# Patient Record
Sex: Male | Born: 1995 | Race: White | Hispanic: No | Marital: Single | Smoking: Current every day smoker
Health system: Southern US, Community
[De-identification: ages and names within clinical notes are randomized; demographics above are authoritative.]

## PROBLEM LIST (undated history)

## (undated) DIAGNOSIS — F32A Depression, unspecified: Secondary | ICD-10-CM

## (undated) DIAGNOSIS — F329 Major depressive disorder, single episode, unspecified: Secondary | ICD-10-CM

## (undated) DIAGNOSIS — F419 Anxiety disorder, unspecified: Secondary | ICD-10-CM

---
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
## 2013-01-09 ENCOUNTER — Ambulatory Visit (HOSPITAL_COMMUNITY): Payer: Self-pay | Admitting: Physician Assistant

## 2014-04-21 ENCOUNTER — Encounter (HOSPITAL_COMMUNITY): Payer: Self-pay | Admitting: Emergency Medicine

## 2014-04-21 ENCOUNTER — Emergency Department (HOSPITAL_COMMUNITY): Payer: Managed Care, Other (non HMO)

## 2014-04-21 ENCOUNTER — Emergency Department (HOSPITAL_COMMUNITY)
Admission: EM | Admit: 2014-04-21 | Discharge: 2014-04-22 | Disposition: A | Discharge status: Home / Self Care | Payer: Managed Care, Other (non HMO) | Attending: Emergency Medicine | Admitting: Emergency Medicine

## 2014-04-21 DIAGNOSIS — IMO0002 Reserved for concepts with insufficient information to code with codable children: Secondary | ICD-10-CM

## 2014-04-21 DIAGNOSIS — R109 Unspecified abdominal pain: Secondary | ICD-10-CM | POA: Diagnosis not present

## 2014-04-21 DIAGNOSIS — N5089 Other specified disorders of the male genital organs: Secondary | ICD-10-CM | POA: Insufficient documentation

## 2014-04-21 DIAGNOSIS — N509 Disorder of male genital organs, unspecified: Secondary | ICD-10-CM | POA: Insufficient documentation

## 2014-04-21 DIAGNOSIS — R599 Enlarged lymph nodes, unspecified: Secondary | ICD-10-CM | POA: Diagnosis not present

## 2014-04-21 LAB — URINE MICROSCOPIC-ADD ON

## 2014-04-21 LAB — URINALYSIS, ROUTINE W REFLEX MICROSCOPIC
Bilirubin Urine: NEGATIVE
GLUCOSE, UA: NEGATIVE mg/dL
Hgb urine dipstick: NEGATIVE
KETONES UR: 15 mg/dL — AB
LEUKOCYTES UA: NEGATIVE
Nitrite: NEGATIVE
PH: 6.5 (ref 5.0–8.0)
PROTEIN: 30 mg/dL — AB
SPECIFIC GRAVITY, URINE: 1.025 (ref 1.005–1.030)
UROBILINOGEN UA: 1 mg/dL (ref 0.0–1.0)

## 2014-04-21 NOTE — ED Notes (Signed)
Pt unable to provide urine specimen at this time, will reattempt.

## 2014-04-21 NOTE — ED Notes (Signed)
Initial Contact - pt A+Ox4, ambulatory in room for comfort, family at bedside.  Pt reports bilat testicular pain today, reports history of similar but it has resolved and this pain is worse than previously.  Pt denies dysuria or other complaints. Skin PWD.  Speaking full/clear sentences, rr even/un-lab.  NAD.

## 2014-04-21 NOTE — ED Provider Notes (Signed)
CSN: [id number]     Arrival date & time 04/21/14  2059 History   First MD Initiated Contact with Patient 04/21/14 2202     Chief Complaint  Patient presents with  . Testicle Pain     (Consider location/radiation/quality/duration/timing/severity/associated sxs/prior Treatment) HPI Comments: Patient presents with c/o scrotal pain that has been occuring frequently for the past one year. Pain more severe and everyday recently. Pain worse with movement of area and palpation. Pain radiates into abdomen and upper legs. No dysuria or penile discharge. No fever. No CP, SOB. No diarrhea. He has noted swelling in 'the tubes that connect my testicles'. The onset of this condition was acute. The course is recurrent. Aggravating factors: none. Alleviating factors: none.    The history is provided by the patient.    History reviewed. No pertinent past medical history. History reviewed. No pertinent past surgical history. Family History  Problem Relation Age of Onset  . Bipolar disorder Father   . Bipolar disorder Paternal Grandmother    History  Substance Use Topics  . Smoking status: Never Smoker   . Smokeless tobacco: Never Used  . Alcohol Use: No    Review of Systems  Constitutional: Negative for fever.  HENT: Negative for rhinorrhea and sore throat.   Eyes: Negative for redness.  Respiratory: Negative for cough.   Cardiovascular: Negative for chest pain.  Gastrointestinal: Positive for abdominal pain. Negative for nausea, vomiting and diarrhea.  Genitourinary: Positive for scrotal swelling and testicular pain. Negative for dysuria, urgency, frequency, hematuria, flank pain, decreased urine volume, discharge, penile swelling, difficulty urinating, genital sores and penile pain.  Musculoskeletal: Negative for back pain and myalgias.  Skin: Negative for rash.  Neurological: Negative for headaches.   Allergies  Review of patient's allergies indicates no known allergies.  Home Medications    Prior to Admission medications   Not on File   BP 123/71  Pulse 94  Temp(Src) 99.3 F (37.4 C) (Oral)  Resp 20  SpO2 100%  Physical Exam  Nursing note and vitals reviewed. Constitutional: He appears well-developed and well-nourished.  HENT:  Head: Normocephalic and atraumatic.  Eyes: Conjunctivae are normal. Right eye exhibits no discharge. Left eye exhibits no discharge.  Neck: Normal range of motion. Neck supple.  Cardiovascular: Normal rate, regular rhythm and normal heart sounds.   Pulmonary/Chest: Effort normal and breath sounds normal.  Abdominal: Soft. There is no tenderness. Hernia confirmed negative in the right inguinal area and confirmed negative in the left inguinal area.  Genitourinary: Penis normal. Right testis shows tenderness. Right testis shows no swelling. Left testis shows tenderness. Left testis shows no swelling. Circumcised. No penile erythema or penile tenderness. No discharge found.  Lymphadenopathy:       Right: Inguinal (small, few, non-tender) adenopathy present.       Left: Inguinal (small, few, non-tender) adenopathy present.  Neurological: He is alert.  Skin: Skin is warm and dry.  Psychiatric: He has a normal mood and affect.    ED Course  Procedures (including critical care time) Labs Review Labs Reviewed  URINALYSIS, ROUTINE W REFLEX MICROSCOPIC - Abnormal; Notable for the following:    Ketones, ur 15 (*)    Protein, ur 30 (*)    All other components within normal limits  URINE MICROSCOPIC-ADD ON    Imaging Review US Scrotum  04/22/2014   CLINICAL DATA:  Bilateral testicular pain/swelling  EXAM: SCROTAL ULTRASOUND  DOPPLER ULTRASOUND OF THE TESTICLES  TECHNIQUE: Complete ultrasound examination of the testicles, epididymis,  and other scrotal structures was performed. Color and spectral Doppler ultrasound were also utilized to evaluate blood flow to the testicles.  COMPARISON:  None.  FINDINGS: Right testicle  Measurements: 4.5 x 2.2 x 2.7  cm. No mass or microlithiasis visualized.  Left testicle  Measurements: 4.5 x 2.0 x 2.6 cm. No mass or microlithiasis visualized.  Right epididymis:  Normal in size and appearance.  Left epididymis:  Normal in size and appearance.  Hydrocele:  None visualized.  Varicocele:  None visualized.  Pulsed Doppler interrogation of both testes demonstrates low resistance arterial and venous waveforms bilaterally.  IMPRESSION: Negative scrotal ultrasound.  No evidence of testicular torsion.   Electronically Signed   By: Charline Bills M.D.   On: 04/22/2014 00:10   [place] Art/ven Flow Abd Pelv Doppler  04/22/2014   CLINICAL DATA:  Bilateral testicular pain/swelling  EXAM: SCROTAL ULTRASOUND  DOPPLER ULTRASOUND OF THE TESTICLES  TECHNIQUE: Complete ultrasound examination of the testicles, epididymis, and other scrotal structures was performed. Color and spectral Doppler ultrasound were also utilized to evaluate blood flow to the testicles.  COMPARISON:  None.  FINDINGS: Right testicle  Measurements: 4.5 x 2.2 x 2.7 cm. No mass or microlithiasis visualized.  Left testicle  Measurements: 4.5 x 2.0 x 2.6 cm. No mass or microlithiasis visualized.  Right epididymis:  Normal in size and appearance.  Left epididymis:  Normal in size and appearance.  Hydrocele:  None visualized.  Varicocele:  None visualized.  Pulsed Doppler interrogation of both testes demonstrates low resistance arterial and venous waveforms bilaterally.  IMPRESSION: Negative scrotal ultrasound.  No evidence of testicular torsion.   Electronically Signed   By: Charline Bills M.D.   On: 04/22/2014 00:10     EKG Interpretation None      10:34 PM Patient seen and examined. Work-up initiated. Patient does not want pain medications.   Vital signs reviewed and are as follows: BP 123/71  Pulse 94  Temp(Src) 99.3 F (37.4 C) (Oral)  Resp 20  SpO2 100%  12:34 AM Pt informed of results. Urology f/u given. Also given pain medications for home. Patient  urged to return with worsening symptoms or other concerns. Patient verbalized understanding and agrees with plan.    MDM   Final diagnoses:  Testicular/scrotal pain   Patient with groin pain x 1 year. Exam is unremarkable except for tenderness. No torsion or other explanation of pain on UA or [place]. No signs of hernia, let alone incarcerated or strangulated hernia.     Renne Crigler, PA-C 04/22/14 [phone]

## 2014-04-21 NOTE — ED Notes (Signed)
Pt arrived to the ED with a complaint of bilateral testicle pain.  Pt states he has recurrent testicle pain for a year but the pain has gradually gotten worse until today when it was unbearable. Pt describes pain as a "swelling of the tube in my testicles that causes pain that shoots into my stomach."

## 2014-04-22 MED ORDER — TRAMADOL HCL 50 MG PO TABS: 50.0000 mg | ORAL_TABLET | Freq: Four times a day (QID) | ORAL | Status: DC | PRN

## 2014-04-22 MED ORDER — IBUPROFEN 600 MG PO TABS: 600.0000 mg | ORAL_TABLET | Freq: Four times a day (QID) | ORAL | Status: DC | PRN

## 2014-04-22 NOTE — ED Provider Notes (Signed)
Medical screening examination/treatment/procedure(s) were performed by non-physician practitioner and as supervising physician I was immediately available for consultation/collaboration.   EKG Interpretation None      Devoria Albe, MD, Armando Gang   Ward Givens, MD 04/22/14 [phone]

## 2014-04-22 NOTE — Discharge Instructions (Signed)
Please read and follow all provided instructions.  Your diagnoses today include:  1. Testicular/scrotal pain     Tests performed today include:  Urine test - no sign of infection in bladder  Ultrasound of scrotum and testicles - shows normal anatomy, no infections, normal blood flow  Vital signs. See below for your results today.   Medications prescribed:   Tramadol - narcotic-like pain medication  DO NOT drive or perform any activities that require you to be awake and alert because this medicine can make you drowsy.    Ibuprofen (Motrin, Advil) - anti-inflammatory pain medication  Do not exceed  ibuprofen every 6 hours, take with food  You have been prescribed an anti-inflammatory medication or NSAID. Take with food. Take smallest effective dose for the shortest duration needed for your pain. Stop taking if you experience stomach pain or vomiting.   Home care instructions:  Follow any educational materials contained in this packet.  Follow-up instructions: Please follow-up with your primary care provider or the urologist listed in the next 7 days for evaluation of your symptoms.   Return instructions:   Please return to the Emergency Department if you experience worsening symptoms.   Return with fever, worsening pain, persistent vomiting, worsening pain in your back.   Please return if you have any other emergent concerns.  Additional Information:  Your vital signs today were: BP 115/70   Pulse 79   Temp(Src) 99.3 F (37.4 C) (Oral)   Resp 15   SpO2 100% If your blood pressure (BP) was elevated above 135/85 this visit, please have this repeated by your doctor within one month. --------------

## 2015-04-14 ENCOUNTER — Ambulatory Visit (HOSPITAL_COMMUNITY)
Admission: RE | Admit: 2015-04-14 | Discharge: 2015-04-14 | Disposition: A | Discharge status: Home / Self Care | Payer: Managed Care, Other (non HMO) | Attending: Psychiatry | Admitting: Psychiatry

## 2015-04-14 DIAGNOSIS — F330 Major depressive disorder, recurrent, mild: Secondary | ICD-10-CM | POA: Diagnosis not present

## 2015-04-14 NOTE — BH Assessment (Signed)
Tele Assessment Note   Francisco Walters is an 19 y.o. male. 19 year old male presents this date at [facility] as a walk in stating he was suffering from depression. Patient denied any SI/HI but stated for the last few days he has been unmotivated and feels, "his life is pretty bad." Patient stated that he does not like his job, desires to have a relationship and is depressed that he is still living at home with his mother. Patient stated he was here at [facility] in 2014 and received services in the form of outpatient care where he was evaluated and given medications for depression. Patient stated after taking the medications for, "a couple weeks" he discontinued them because he felt they were not working. Patient denied any SI or H/I and stated he would never harm himself but is just tired of "feeling bad." Patient stated he was not interested in any type of inpatient treatment or therapy groups at this time and just wanted some medications to assist with his depression. This Clinical research associate spoke with Aggie NP and reviewed the case with NP agreeing he did not meet the criteria for inpatient care but could make an appointment with our outpatient clinic for further evaluation. This Clinical research associate also informed patient that he could go to [place] as a walk in client and be seen there also. Patient was given information in reference to multiple treatment providers in the area and had him sign a informed consent to refuse medical screening at [facility]. Patient stated he would review the information provided and was encouraged to utilize the crisis line if needed.       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 Axis IV: occupational problems and other psychosocial or environmental problems Axis V: 61-70 mild symptoms  Past Medical History: No past medical history on file.  No past surgical history on file.  Family History:  Family History  Problem Relation Age of Onset  .  Bipolar disorder Father   . Bipolar disorder Paternal Grandmother     Social History:  reports that he has never smoked. He has never used smokeless tobacco. He reports that he uses illicit drugs (Marijuana). He reports that he does not drink alcohol.  Additional Social History:  Alcohol / Drug Use Pain Medications: None Prescriptions: None Over the Counter: None History of alcohol / drug use?: No history of alcohol / drug abuse Longest period of sobriety (when/how long): NA  CIWA:   COWS:    PATIENT STRENGTHS: (choose at least two) Ability for insight Average or above average intelligence Metallurgist fund of knowledge Motivation for treatment/growth Physical Health Supportive family/friends  Allergies: No Known Allergies  Home Medications:  (Not in a hospital admission)  OB/GYN Status:  No LMP for male patient.  General Assessment Data Location of Assessment: [facility] Assessment Services TTS Assessment: In system Is this a Tele or Face-to-Face Assessment?: Face-to-Face Is this an Initial Assessment or a Re-assessment for this encounter?: Initial Assessment Marital status: Single Maiden name: NA Is patient pregnant?: No Pregnancy Status: No Living Arrangements: Parent Can pt return to current living arrangement?: Yes Admission Status: Voluntary Is patient capable of signing voluntary admission?: Yes Referral Source: Self/Family/Friend Insurance type: Product/process development scientist Exam [facility] Walk-in ONLY) Medical Exam completed: Yes  Crisis Care Plan Living Arrangements: Parent Name of Psychiatrist: None Name of Therapist: None  Education Status Is patient currently in school?: No  Current Grade: NA Highest grade of school patient has completed: 12 Name of school: [employer] person: Kamdyn Colborn  Risk to self with the past 6 months Suicidal Ideation: No Has patient been a risk to self within the past 6 months prior to  admission? : No Suicidal Intent: No Has patient had any suicidal intent within the past 6 months prior to admission? : No Is patient at risk for suicide?: No Suicidal Plan?: No Has patient had any suicidal plan within the past 6 months prior to admission? : No Access to Means: No What has been your use of drugs/alcohol within the last 12 months?: Denies Previous Attempts/Gestures: No How many times?: 0 Other Self Harm Risks: None noted Triggers for Past Attempts: Other (Comment) (None noted) Intentional Self Injurious Behavior: None Family Suicide History: No Recent stressful life event(s): Other (Comment) (Problems at work) Persecutory voices/beliefs?: No Depression: Yes Depression Symptoms: Loss of interest in usual pleasures, Feeling worthless/self pity Substance abuse history and/or treatment for substance abuse?: No Suicide prevention information given to non-admitted patients: Yes  Risk to Others within the past 6 months Homicidal Ideation: No Does patient have any lifetime risk of violence toward others beyond the six months prior to admission? : No Thoughts of Harm to Others: No Current Homicidal Intent: No Current Homicidal Plan: No Access to Homicidal Means: No Identified Victim: NA History of harm to others?: No Assessment of Violence: None Noted Violent Behavior Description: NA Does patient have access to weapons?: No Criminal Charges Pending?: No Does patient have a court date: No Is patient on probation?: No  Psychosis Hallucinations: None noted Delusions: None noted  Mental Status Report Appearance/Hygiene: Unremarkable Eye Contact: Good Motor Activity: Unremarkable Speech: Soft Level of Consciousness: Alert Mood: Depressed Affect: Depressed Anxiety Level: None Thought Processes: Coherent, Relevant Judgement: Unimpaired Orientation: Person, Place, Situation, Time Obsessive Compulsive Thoughts/Behaviors: None  Cognitive Functioning Concentration:  Normal Memory: Recent Intact, Remote Intact IQ: Average Insight: Good Impulse Control: Good Appetite: Fair Weight Loss: 0 Weight Gain: 0 Sleep: No Change Total Hours of Sleep: 8 Vegetative Symptoms: None  ADLScreening [facility] Assessment Services) Patient's cognitive ability adequate to safely complete daily activities?: Yes Patient able to express need for assistance with ADLs?: Yes Independently performs ADLs?: Yes (appropriate for developmental age)  Prior Inpatient Therapy Prior Inpatient Therapy: No Prior Therapy Dates: None Prior Therapy Facilty/Provider(s): None Reason for Treatment: NA  Prior Outpatient Therapy Prior Outpatient Therapy: Yes Prior Therapy Dates: 2014 Prior Therapy Facilty/Provider(s): [facility] (patient stated he received medications on a outpatient bases) Reason for Treatment: Depression Does patient have an ACCT team?: No Does patient have Intensive In-House Services?  : No Does patient have [facility] services? : No Does patient have P4CC services?: No  ADL Screening (condition at time of admission) Patient's cognitive ability adequate to safely complete daily activities?: Yes Is the patient deaf or have difficulty hearing?: No Does the patient have difficulty seeing, even when wearing glasses/contacts?: No Does the patient have difficulty concentrating, remembering, or making decisions?: No Patient able to express need for assistance with ADLs?: Yes Independently performs ADLs?: Yes (appropriate for developmental age) Does the patient have difficulty walking or climbing stairs?: No Weakness of Legs: None Weakness of Arms/Hands: None  Home Assistive Devices/Equipment Home Assistive Devices/Equipment: None  Therapy Consults (therapy consults require a physician order) PT Evaluation Needed: No OT Evalulation Needed: No SLP Evaluation Needed: No Abuse/Neglect Assessment (Assessment to be complete while patient is alone) Physical Abuse: Denies Verbal  Abuse:  Denies Sexual Abuse: Denies Exploitation of patient/patient's resources: Denies Self-Neglect: Denies Values / Beliefs Cultural Requests During Hospitalization: None Spiritual Requests During Hospitalization: None Consults Spiritual Care Consult Needed: No Social Work Consult Needed: No Merchant navy officer (For Healthcare) Does patient have an advance directive?: No    Additional Information 1:1 In Past 12 Months?: No CIRT Risk: No Elopement Risk: No Does patient have medical clearance?: Yes     Disposition: Patient was not interested in [facility] services and was just interested in researching what providers were in the area that could assist him with his depression. Patient was provided with information on depression and given a list of local providers along with crisis numbers to contact if his depression worsened. Patient currently denied any thoughts of S/I or H/I at the time of the assessment.    Disposition Initial Assessment Completed for this Encounter: Yes Disposition of Patient: Referred to [facility]) Patient referred to: Other (Comment) [facility])  Alfredia Ferguson 04/14/2015 2:21 PM

## 2015-08-05 ENCOUNTER — Emergency Department (HOSPITAL_COMMUNITY)
Admission: EM | Admit: 2015-08-05 | Discharge: 2015-08-06 | Disposition: A | Discharge status: Other Acute Inpatient Hospital | Payer: Managed Care, Other (non HMO) | Source: Home / Self Care | Attending: Emergency Medicine | Admitting: Emergency Medicine

## 2015-08-05 ENCOUNTER — Encounter (HOSPITAL_COMMUNITY): Payer: Self-pay | Admitting: Emergency Medicine

## 2015-08-05 DIAGNOSIS — F411 Generalized anxiety disorder: Secondary | ICD-10-CM | POA: Diagnosis not present

## 2015-08-05 DIAGNOSIS — F329 Major depressive disorder, single episode, unspecified: Secondary | ICD-10-CM | POA: Insufficient documentation

## 2015-08-05 DIAGNOSIS — F419 Anxiety disorder, unspecified: Secondary | ICD-10-CM | POA: Insufficient documentation

## 2015-08-05 DIAGNOSIS — R45851 Suicidal ideations: Secondary | ICD-10-CM | POA: Diagnosis present

## 2015-08-05 DIAGNOSIS — F1210 Cannabis abuse, uncomplicated: Secondary | ICD-10-CM | POA: Insufficient documentation

## 2015-08-05 DIAGNOSIS — F1014 Alcohol abuse with alcohol-induced mood disorder: Secondary | ICD-10-CM | POA: Diagnosis not present

## 2015-08-05 DIAGNOSIS — K709 Alcoholic liver disease, unspecified: Secondary | ICD-10-CM | POA: Diagnosis not present

## 2015-08-05 DIAGNOSIS — F17210 Nicotine dependence, cigarettes, uncomplicated: Secondary | ICD-10-CM | POA: Diagnosis not present

## 2015-08-05 DIAGNOSIS — F332 Major depressive disorder, recurrent severe without psychotic features: Secondary | ICD-10-CM | POA: Diagnosis not present

## 2015-08-05 DIAGNOSIS — F32A Depression, unspecified: Secondary | ICD-10-CM

## 2015-08-05 DIAGNOSIS — Z23 Encounter for immunization: Secondary | ICD-10-CM | POA: Diagnosis not present

## 2015-08-05 LAB — CBC
HEMATOCRIT: 42.4 % (ref 39.0–52.0)
HEMOGLOBIN: 12.7 g/dL — AB (ref 13.0–17.0)
MCH: 21.2 pg — AB (ref 26.0–34.0)
MCHC: 30 g/dL (ref 30.0–36.0)
MCV: 70.8 fL — ABNORMAL LOW (ref 78.0–100.0)
Platelets: 282 10*3/uL (ref 150–400)
RBC: 5.99 MIL/uL — AB (ref 4.22–5.81)
RDW: 17.6 % — ABNORMAL HIGH (ref 11.5–15.5)
WBC: 18.5 10*3/uL — ABNORMAL HIGH (ref 4.0–10.5)

## 2015-08-05 LAB — COMPREHENSIVE METABOLIC PANEL
ALBUMIN: 4.3 g/dL (ref 3.5–5.0)
ALK PHOS: 525 U/L — AB (ref 38–126)
ALT: 125 U/L — AB (ref 17–63)
AST: 73 U/L — ABNORMAL HIGH (ref 15–41)
Anion gap: 10 (ref 5–15)
BUN: 19 mg/dL (ref 6–20)
CALCIUM: 10 mg/dL (ref 8.9–10.3)
CHLORIDE: 105 mmol/L (ref 101–111)
CO2: 26 mmol/L (ref 22–32)
CREATININE: 0.69 mg/dL (ref 0.61–1.24)
GFR calc non Af Amer: >60 mL/min (ref >60)
GLUCOSE: 70 mg/dL (ref 65–99)
Potassium: 3.6 mmol/L (ref 3.5–5.1)
SODIUM: 141 mmol/L (ref 135–145)
Total Bilirubin: 0.7 mg/dL (ref 0.3–1.2)
Total Protein: 8 g/dL (ref 6.5–8.1)

## 2015-08-05 LAB — RAPID URINE DRUG SCREEN, HOSP PERFORMED
AMPHETAMINES: NOT DETECTED
BARBITURATES: NOT DETECTED
Benzodiazepines: NOT DETECTED
COCAINE: NOT DETECTED
OPIATES: NOT DETECTED
TETRAHYDROCANNABINOL: POSITIVE — AB

## 2015-08-05 LAB — ETHANOL: Alcohol, Ethyl (B): 125 mg/dL — ABNORMAL HIGH (ref <5)

## 2015-08-05 LAB — SALICYLATE LEVEL

## 2015-08-05 LAB — ACETAMINOPHEN LEVEL

## 2015-08-05 MED ORDER — ZOLPIDEM TARTRATE 5 MG PO TABS: 5.0000 mg | ORAL_TABLET | Freq: Every evening | ORAL | Status: DC | PRN

## 2015-08-05 MED ORDER — LORAZEPAM 1 MG PO TABS: 1.0000 mg | ORAL_TABLET | Freq: Three times a day (TID) | ORAL | Status: DC | PRN

## 2015-08-05 MED ORDER — ONDANSETRON HCL 4 MG PO TABS: 4.0000 mg | ORAL_TABLET | Freq: Three times a day (TID) | ORAL | Status: DC | PRN

## 2015-08-05 MED ORDER — ALUM & MAG HYDROXIDE-SIMETH 200-200-20 MG/5ML PO SUSP: 30.0000 mL | ORAL | Status: DC | PRN

## 2015-08-05 MED ORDER — ACETAMINOPHEN 325 MG PO TABS: 650.0000 mg | ORAL_TABLET | ORAL | Status: DC | PRN

## 2015-08-05 MED ORDER — IBUPROFEN 400 MG PO TABS: 600.0000 mg | ORAL_TABLET | Freq: Three times a day (TID) | ORAL | Status: DC | PRN

## 2015-08-05 NOTE — ED Provider Notes (Addendum)
CSN: [id number]     Arrival date & time 08/05/15  2211 History   First MD Initiated Contact with Patient 08/05/15 2249     Chief Complaint  Patient presents with  . Suicidal    The patient says he has been feeling depressed and has been wanting to hurt himself.  He denies HI.  He does have a cut on his right forearm.       (Consider location/radiation/quality/duration/timing/severity/associated sxs/prior Treatment) HPI 19 year old male who presents with increasing depression and suicide ideation. States history of depression that he has been dealing with for about 6 years now. He is to follow-up with a psychiatrist one to 2 years ago, but has not followed up with them since then. States that he's been having relationship issues more recently and has had increasing thoughts of suicide and depression over the course of the past 2 weeks. States he is having difficulty coping, and tonight attempted to slit his wrist. Reports taking 3 shots of hard liquor tonight prior to coming to the emergency department. Smokes marijuana but denies any other illicit drug use or coingestions. States that he otherwise has been in his usual state of health. Denies fevers, chills, headaches, confusion, upper respiratory symptoms or GI symptoms. No prior inpatient hospitalizations for psychiatric illness. History reviewed. No pertinent past medical history. History reviewed. No pertinent past surgical history. Family History  Problem Relation Age of Onset  . Bipolar disorder Father   . Bipolar disorder Paternal Grandmother    Social History  Substance Use Topics  . Smoking status: Never Smoker   . Smokeless tobacco: Never Used  . Alcohol Use: No    Review of Systems 10/14 systems reviewed and are negative other than those stated in the HPI   Allergies  Review of patient's allergies indicates no known allergies.  Home Medications   Prior to Admission medications   Medication Sig Start Date End Date  Taking? Authorizing Provider  ibuprofen (ADVIL,MOTRIN) 600 MG tablet Take 1 tablet (600 mg total) by mouth every 6 (six) hours as needed. Patient not taking: Reported on 08/05/2015 04/22/14   Renne Crigler, PA-C  traMADol (ULTRAM) 50 MG tablet Take 1 tablet (50 mg total) by mouth every 6 (six) hours as needed. Patient not taking: Reported on 08/05/2015 04/22/14   Renne Crigler, PA-C   BP 102/53 mmHg  Pulse 75  Temp(Src) 98 F (36.7 C) (Oral)  Resp 14  SpO2 98% Physical Exam Physical Exam  Nursing note and vitals reviewed. Constitutional: Well developed, well nourished, non-toxic, and in no acute distress Head: Normocephalic and atraumatic.  Mouth/Throat: Oropharynx is clear and moist.  Neck: Normal range of motion. Neck supple.  Cardiovascular: Normal rate and regular rhythm.   Pulmonary/Chest: Effort normal and breath sounds normal.  Abdominal: Soft. There is no tenderness. There is no rebound and no guarding.  Musculoskeletal: Normal range of motion. Superficial laceration over right wrist, vertical 5-6 cm over volar surface of forearm. Neurological: Alert, no facial droop, fluent speech, moves all extremities symmetrically Skin: Skin is warm and dry.  Psychiatric: Cooperative  ED Course  Procedures (including critical care time) Labs Review Labs Reviewed  COMPREHENSIVE METABOLIC PANEL - Abnormal; Notable for the following:    AST 73 (*)    ALT 125 (*)    Alkaline Phosphatase 525 (*)    All other components within normal limits  ETHANOL - Abnormal; Notable for the following:    Alcohol, Ethyl (B) 125 (*)    All other  components within normal limits  ACETAMINOPHEN LEVEL - Abnormal; Notable for the following:    Acetaminophen (Tylenol), Serum <10 (*)    All other components within normal limits  CBC - Abnormal; Notable for the following:    WBC 18.5 (*)    RBC 5.99 (*)    Hemoglobin 12.7 (*)    MCV 70.8 (*)    MCH 21.2 (*)    RDW 17.6 (*)    All other components within  normal limits  URINE RAPID DRUG SCREEN, HOSP PERFORMED - Abnormal; Notable for the following:    Tetrahydrocannabinol POSITIVE (*)    All other components within normal limits  SALICYLATE LEVEL    Imaging Review No results found. I have personally reviewed and evaluated these images and lab results as part of my medical decision-making.   EKG Interpretation None      MDM   Final diagnoses:  Suicidal ideation  Depression    19 year old male with history of depression who presents with suicidal ideation and increasing depression over the course of the past 2 weeks. He is nontoxic and in no acute distress. Vital signs are non-concerning. There is a superficial vertical laceration over the right wrist, without active bleeding. Do not suspect acute medical cause of symptoms. I will obtain screening mental health blood work. Plan to consult TTS regarding further management.  Blood work showing elevated LFTs which may be secondary to etoh abuse. No abdominal pain or GI symptoms to suggest acute intraabdominal process. He also has leukocytosis without complaints of infectious symptoms. Has mild cough 2/2 smoking, but will obtain CXr to rule out PNA, which is unlikely. No other symptoms to suggest infection or inflammatory process, and I feel his WBC count is more likely from stress response. He is medically clear if CXR negative for psych management.    Lavera Guiseana Duo Zion Ta, MD 08/06/15 [phone]  Lavera Guiseana Duo Malyk Girouard, MD 08/06/15 [phone]

## 2015-08-05 NOTE — ED Notes (Signed)
The patient says he has been feeling depressed and has been wanting to hurt himself.  He denies HI.  He does have a cut on his right forearm.  The patient has been treated for depression and he does admit to using marijuana and alcohol.

## 2015-08-06 ENCOUNTER — Encounter (HOSPITAL_COMMUNITY): Payer: Self-pay | Admitting: Behavioral Health

## 2015-08-06 ENCOUNTER — Observation Stay (HOSPITAL_COMMUNITY)
Admission: AD | Admit: 2015-08-06 | Discharge: 2015-08-07 | Disposition: A | Discharge status: Home / Self Care | Payer: Managed Care, Other (non HMO) | Source: Intra-hospital | Attending: Psychiatry | Admitting: Psychiatry

## 2015-08-06 ENCOUNTER — Emergency Department (HOSPITAL_COMMUNITY): Payer: Managed Care, Other (non HMO)

## 2015-08-06 DIAGNOSIS — F332 Major depressive disorder, recurrent severe without psychotic features: Principal | ICD-10-CM

## 2015-08-06 DIAGNOSIS — R45851 Suicidal ideations: Secondary | ICD-10-CM

## 2015-08-06 DIAGNOSIS — F17210 Nicotine dependence, cigarettes, uncomplicated: Secondary | ICD-10-CM | POA: Insufficient documentation

## 2015-08-06 DIAGNOSIS — F1014 Alcohol abuse with alcohol-induced mood disorder: Secondary | ICD-10-CM | POA: Insufficient documentation

## 2015-08-06 DIAGNOSIS — Z23 Encounter for immunization: Secondary | ICD-10-CM | POA: Insufficient documentation

## 2015-08-06 DIAGNOSIS — F411 Generalized anxiety disorder: Secondary | ICD-10-CM | POA: Insufficient documentation

## 2015-08-06 DIAGNOSIS — K709 Alcoholic liver disease, unspecified: Secondary | ICD-10-CM | POA: Insufficient documentation

## 2015-08-06 HISTORY — DX: Anxiety disorder, unspecified: F41.9

## 2015-08-06 HISTORY — DX: Depression, unspecified: F32.A

## 2015-08-06 HISTORY — DX: Major depressive disorder, single episode, unspecified: F32.9

## 2015-08-06 MED ORDER — TRAZODONE HCL 50 MG PO TABS
50.0000 mg | ORAL_TABLET | Freq: Every evening | ORAL | Status: DC | PRN
  Administered 2015-08-06: 50 mg via ORAL
  Filled 2015-08-06: qty 1

## 2015-08-06 MED ORDER — PNEUMOCOCCAL VAC POLYVALENT 25 MCG/0.5ML IJ INJ
0.5000 mL | INJECTION | INTRAMUSCULAR | Status: AC
  Administered 2015-08-07: 0.5 mL via INTRAMUSCULAR
  Filled 2015-08-06: qty 0.5

## 2015-08-06 MED ORDER — NICOTINE 14 MG/24HR TD PT24
14.0000 mg | MEDICATED_PATCH | Freq: Every day | TRANSDERMAL | Status: DC
  Administered 2015-08-06 – 2015-08-07 (×2): 14 mg via TRANSDERMAL
  Filled 2015-08-06 (×2): qty 1

## 2015-08-06 MED ORDER — ACETAMINOPHEN 325 MG PO TABS: 650.0000 mg | ORAL_TABLET | Freq: Four times a day (QID) | ORAL | Status: DC | PRN

## 2015-08-06 MED ORDER — HYDROXYZINE HCL 25 MG PO TABS
25.0000 mg | ORAL_TABLET | Freq: Four times a day (QID) | ORAL | Status: DC | PRN
  Administered 2015-08-06: 25 mg via ORAL
  Filled 2015-08-06: qty 1

## 2015-08-06 MED ORDER — MAGNESIUM HYDROXIDE 400 MG/5ML PO SUSP: 30.0000 mL | Freq: Every day | ORAL | Status: DC | PRN

## 2015-08-06 MED ORDER — NICOTINE 21 MG/24HR TD PT24
21.0000 mg | MEDICATED_PATCH | Freq: Every day | TRANSDERMAL | Status: DC
  Administered 2015-08-06 (×2): 21 mg via TRANSDERMAL
  Filled 2015-08-06 (×2): qty 1

## 2015-08-06 MED ORDER — ALUM & MAG HYDROXIDE-SIMETH 200-200-20 MG/5ML PO SUSP: 30.0000 mL | ORAL | Status: DC | PRN

## 2015-08-06 MED ORDER — ENSURE ENLIVE PO LIQD
237.0000 mL | Freq: Two times a day (BID) | ORAL | Status: DC
  Administered 2015-08-06 – 2015-08-07 (×3): 237 mL via ORAL

## 2015-08-06 MED ORDER — INFLUENZA VAC SPLIT QUAD 0.5 ML IM SUSY
0.5000 mL | PREFILLED_SYRINGE | INTRAMUSCULAR | Status: AC
  Administered 2015-08-07: 0.5 mL via INTRAMUSCULAR
  Filled 2015-08-06: qty 0.5

## 2015-08-06 NOTE — Progress Notes (Addendum)
Patient ID: Francisco Walters, male   DOB: [birth year], 19 y.o.   MRN: [id number] [facility]. Pt presented anxious on admit. Pt endorses suicidal thoughts with no plan. Pt verbally contracts to no self harm behaviors. Pt reported ongoing depression for the last six years. Most recent trigger, pt broke up with his girlfriend last night. Pt stated that they have been dating on and off for the last two years. Pt found out that his ex-girlfriend is seeing another guy. Pt also reported that his friend killed himself in September, 2010, by hanging himself. Pt stated that he have lost around 13-15 lbs due to poor appetite and eating. Ensure ordered per protocol. Pt noted to have a self inflicted laceration to his rgt forearm (superficial). No drainage noted. Pt reported smoking THC daily, 1/2 gram and drinking (shots of vodka) occasionally. Pt denies taking any prescribed meds. Pt c/o lower chronic back pain. No injuries reported. Pt contributed the pain to long hours of standing at work. NP notified of pt complaint. Pt under continuous observation, except during bathroom usage.

## 2015-08-06 NOTE — ED Notes (Signed)
Attempted to call [facility] OBS with update on pt.  Nobody available at this time.  Will continue to try and contact.

## 2015-08-06 NOTE — Progress Notes (Signed)
[facility] by Dr. Lucianne MussKumar.   Admission is voluntary. Report number is [phone].  Ilean SkillMeghan Isaic Syler, MSW, LCSW Clinical Social Work, Disposition  08/06/2015 [phone]

## 2015-08-06 NOTE — BH Assessment (Signed)
[facility] Assessment Progress Note  Patient was seen this date by [place] DNP and Jazell Rosenau LCAS to evaluate on admission. Patient states he has multiple stressors in his life and a recent breakup with his girlfriend that caused him to have S/I presenting with a superficial laceration to his left wrist. Patient stated at the he "did not want to live," but was afraid of the pain that would be caused by the injury. Patient reports ongoing weight loss and reports he only sleeps about four hours a night. Patient reports daily THC use reporting using approx. a gram a day with last use being on 08/05/15. Patient states he is self medicating for depression. Patient will be evaluated for medication evaluation and is interested in outpatient counseling once he is discharged. Patient will be seen in the a.m. to determine disposition.
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
## 2015-08-06 NOTE — ED Notes (Signed)
Attempted to contact Behavioral Health, no one was available.  Will continue to contact.

## 2015-08-06 NOTE — H&P (Signed)
[facility] OBS UNIT H&P   Patient Identification: Francisco Walters MRN:  [id number] Principal Diagnosis: MDD (major depressive disorder), recurrent severe, without psychosis ([place]) Diagnosis:   Patient Active Problem List   Diagnosis Date Noted  . MDD (major depressive disorder), recurrent severe, without psychosis ([place]) [F33.2] 08/06/2015    Priority: High  . Generalized anxiety disorder [F41.1] 09/07/2012  . Depressive disorder, not elsewhere classified [F32.9] 09/17/2011    Total Time spent with patient: 45 minutes  Subjective:   Francisco Walters is a 19 y.o. male patient admitted with reports of suicidal ideation with plan/intent and made a superficial vertical laceration, not requiring sutures, to his right anterior distal forearm region. No s&s infection at this time. Pt seen and chart reviewed. Pt is alert/oriented x4, calm, cooperative, and appropriate to situation. Pt denies homicidal ideation and psychosis and does not appear to be responding to internal stimuli. Pt continues to affirm suicidal ideation, passive, without plan/intent. He would like to seek outpatient psychiatry and counseling after receiving medications for insomnia and anxiety.   HPI:  I have reviewed and concur with HPI elements below, modified as follows: Francisco Walters is an 19 y.o. male.  -Clinician reviewed note by Dr. Brantley Stage. Patient was brought in by mother. Patient has been having thoughts about killing himself for the last 6 years but it has gotten worse over the last two weeks. Tonight he used a pocket knife and made a scratch to his right wrist.  Patient endorses still having thoughts of killing himself. He does have thoughts more over the last 2 weeks. He says he has had a change in relationship during this time period. Patient says however that the breakup does not have to have been the only thing to make him feel this way. He says it would have been something else. Patient says that he feels he  may not actually carry through with it because he does not want to have physical pain. Patient denies any thoughts of wanting to kill anyone. Denies any A/V hallucinations.  Patient uses marijuana daily and had some yesterday. He reports drinking vodka on occasions when he has depression. Patient says he may drink 4 shots at a time and do this around three times in a month. Patient used to see a psychiatrist about three years ago and had medication at that time. Patient now has no current outpatient care. He has no previous inpatient history.  Pt arrived in the OBS UNIT and is cooperative, awaiting assessment as above.   Past Medical History:  Past Medical History  Diagnosis Date  . Depression   . Anxiety    History reviewed. No pertinent past surgical history. Family History:  Family History  Problem Relation Age of Onset  . Bipolar disorder Father   . Bipolar disorder Paternal Grandmother    Social History:  History  Alcohol Use  . 3.0 oz/week  . 5 Shots of liquor per week     History  Drug Use  . Yes  . Special: Marijuana    Comment: rare use - 5 times in two years    Social History   Social History  . Marital Status: Single    Spouse Name: N/A  . Number of Children: N/A  . Years of Education: N/A   Occupational History  . Student     11th grade    Social History Main Topics  . Smoking status: Current Every Day Smoker -- 1.00 packs/day  Types: Cigarettes  . Smokeless tobacco: Never Used  . Alcohol Use: 3.0 oz/week    5 Shots of liquor per week  . Drug Use: Yes    Special: Marijuana     Comment: rare use - 5 times in two years  . Sexual Activity: No   Other Topics Concern  . None   Social History Narrative   Additional Social History:                          Allergies:  No Known Allergies  Labs:  Results for orders placed or performed during the hospital encounter of 08/05/15 (from the past 48 hour(s))  Urine rapid drug screen  (hosp performed) (Not at [facility])     Status: Abnormal   Collection Time: 08/05/15 10:41 PM  Result Value Ref Range   Opiates NONE DETECTED NONE DETECTED   Cocaine NONE DETECTED NONE DETECTED   Benzodiazepines NONE DETECTED NONE DETECTED   Amphetamines NONE DETECTED NONE DETECTED   Tetrahydrocannabinol POSITIVE (A) NONE DETECTED   Barbiturates NONE DETECTED NONE DETECTED    Comment:        DRUG SCREEN FOR MEDICAL PURPOSES ONLY.  IF CONFIRMATION IS NEEDED FOR ANY PURPOSE, NOTIFY LAB WITHIN 5 DAYS.        LOWEST DETECTABLE LIMITS FOR URINE DRUG SCREEN Drug Class       Cutoff (ng/mL) Amphetamine      1000 Barbiturate      200 Benzodiazepine   283 Tricyclics       662 Opiates          300 Cocaine          300 THC              50   Comprehensive metabolic panel     Status: Abnormal   Collection Time: 08/05/15 10:50 PM  Result Value Ref Range   Sodium 141 135 - 145 mmol/L   Potassium 3.6 3.5 - 5.1 mmol/L   Chloride 105 101 - 111 mmol/L   CO2 26 22 - 32 mmol/L   Glucose, Bld 70 65 - 99 mg/dL   BUN 19 6 - 20 mg/dL   Creatinine, Ser 0.69 0.61 - 1.24 mg/dL   Calcium 10.0 8.9 - 10.3 mg/dL   Total Protein 8.0 6.5 - 8.1 g/dL   Albumin 4.3 3.5 - 5.0 g/dL   AST 73 (H) 15 - 41 U/L   ALT 125 (H) 17 - 63 U/L   Alkaline Phosphatase 525 (H) 38 - 126 U/L   Total Bilirubin 0.7 0.3 - 1.2 mg/dL   GFR calc non Af Amer >60 >60 mL/min   GFR calc Af Amer >60 >60 mL/min    Comment: (NOTE) The eGFR has been calculated using the CKD EPI equation. This calculation has not been validated in all clinical situations. eGFR's persistently <60 mL/min signify possible Chronic Kidney Disease.    Anion gap 10 5 - 15  Ethanol (ETOH)     Status: Abnormal   Collection Time: 08/05/15 10:50 PM  Result Value Ref Range   Alcohol, Ethyl (B) 125 (H) <5 mg/dL    Comment:        LOWEST DETECTABLE LIMIT FOR SERUM ALCOHOL IS 5 mg/dL FOR MEDICAL PURPOSES ONLY   Salicylate level     Status: None   Collection  Time: 08/05/15 10:50 PM  Result Value Ref Range   Salicylate Lvl <9.4 2.8 - 30.0 mg/dL  Acetaminophen  level     Status: Abnormal   Collection Time: 08/05/15 10:50 PM  Result Value Ref Range   Acetaminophen (Tylenol), Serum <10 (L) 10 - 30 ug/mL    Comment:        THERAPEUTIC CONCENTRATIONS VARY SIGNIFICANTLY. A RANGE OF 10-30 ug/mL MAY BE AN EFFECTIVE CONCENTRATION FOR MANY PATIENTS. HOWEVER, SOME ARE BEST TREATED AT CONCENTRATIONS OUTSIDE THIS RANGE. ACETAMINOPHEN CONCENTRATIONS >150 ug/mL AT 4 HOURS AFTER INGESTION AND >50 ug/mL AT 12 HOURS AFTER INGESTION ARE OFTEN ASSOCIATED WITH TOXIC REACTIONS.   CBC     Status: Abnormal   Collection Time: 08/05/15 10:50 PM  Result Value Ref Range   WBC 18.5 (H) 4.0 - 10.5 K/uL   RBC 5.99 (H) 4.22 - 5.81 MIL/uL   Hemoglobin 12.7 (L) 13.0 - 17.0 g/dL   HCT 42.4 39.0 - 52.0 %   MCV 70.8 (L) 78.0 - 100.0 fL   MCH 21.2 (L) 26.0 - 34.0 pg   MCHC 30.0 30.0 - 36.0 g/dL   RDW 17.6 (H) 11.5 - 15.5 %   Platelets 282 150 - 400 K/uL    Vitals: Blood pressure 135/93, pulse 99, temperature 98.3 F (36.8 C), temperature source Oral, resp. rate 18, height 5' 11"  (1.803 m), weight 55.339 kg (122 lb), SpO2 100 %.  Risk to Self: Is patient at risk for suicide?: Yes Risk to Others:   Prior Inpatient Therapy:   Prior Outpatient Therapy:    Current Facility-Administered Medications  Medication Dose Route Frequency Provider Last Rate Last Dose  . acetaminophen (TYLENOL) tablet 650 mg  650 mg Oral Q6H PRN Benjamine Mola, FNP      . alum & mag hydroxide-simeth (MAALOX/MYLANTA) 200-200-20 MG/5ML suspension 30 mL  30 mL Oral Q4H PRN Benjamine Mola, FNP      . feeding supplement (ENSURE ENLIVE) (ENSURE ENLIVE) liquid 237 mL  237 mL Oral BID BM Benjamine Mola, FNP   237 mL at 08/06/15 1135  . [START ON 08/07/2015] Influenza vac split quadrivalent PF (FLUARIX) injection 0.5 mL  0.5 mL Intramuscular Tomorrow-1000 Jozelynn Danielson C Akaila Rambo, FNP      . magnesium  hydroxide (MILK OF MAGNESIA) suspension 30 mL  30 mL Oral Daily PRN Benjamine Mola, FNP      . [START ON 08/07/2015] pneumococcal 23 valent vaccine (PNU-IMMUNE) injection 0.5 mL  0.5 mL Intramuscular Tomorrow-1000 Benjamine Mola, FNP        Musculoskeletal: Strength & Muscle Tone: within normal limits Gait & Station: normal Patient leans: N/A  Psychiatric Specialty Exam: Physical Exam  ROS  Blood pressure 135/93, pulse 99, temperature 98.3 F (36.8 C), temperature source Oral, resp. rate 18, height 5' 11"  (1.803 m), weight 55.339 kg (122 lb), SpO2 100 %.Body mass index is 17.02 kg/(m^2).  General Appearance: Casual and Fairly Groomed  Engineer, water::  Good  Speech:  Clear and Coherent and Normal Rate  Volume:  Normal  Mood:  Anxious and Depressed  Affect:  Appropriate, Congruent and Depressed  Thought Process:  Circumstantial  Orientation:  Full (Time, Place, and Person)  Thought Content:  WDL  Suicidal Thoughts:  Yes.  without intent/plan  Homicidal Thoughts:  No  Memory:  Immediate;   Fair Recent;   Fair Remote;   Fair  Judgement:  Fair  Insight:  Fair  Psychomotor Activity:  Normal  Concentration:  Fair  Recall:  [employer]  Language: Fair  Akathisia:  No  Handed:    AIMS (if  indicated):     Assets:  Communication Skills Desire for Improvement Physical Health Resilience Social Support  ADL's:  Intact  Cognition: WNL  Sleep:      Treatment Plan Summary: -Vistaril 80m q6h prn anxiety -Trazodone 560mqhs prn insomnia -Refer to outpatient psychiatry/counseling -Supportive coping/instruction/discussion with nursing staff  Disposition:  -Overnight Observation unit stay for safety and stabilization.   WiBenjamine MolaFNP-BC 08/06/2015 11:45 AM

## 2015-08-06 NOTE — Progress Notes (Signed)
Rolanda LundborgNicholas Blanda in bed #3.  Alert, quiet and watching TV.  Pt states that his earlier Vistaril "did not work".  Asking if I know that he cut himself.  Lateral superficial cut to right wrist.  Pt states current passive SI.  States he is worried about the "pain" involved with suicide. Denies HI, AV/H.  Pt denied hx of smoking but presented with Nicoderm patch to rt arm and asking for another patch.  Patch applied.  Very quiet, polite and cooperative.  Agrees to contract for safety verbally.  Will continue to monitor pt.

## 2015-08-06 NOTE — ED Notes (Signed)
[facility] called for transport for pt.  All pt belongings were previously sent home with pt mother.

## 2015-08-06 NOTE — ED Notes (Signed)
[facility] stating that they are concerned with patient wbc count of 18.5. i spoke with dr Verdie Mosherliu and she ordered a chest xray and states it is most likely a stress response and if xray is normal than no treatment will take place. [facility]. Will reattempt.

## 2015-08-06 NOTE — ED Notes (Signed)
Pt was resting comfortably while this ED Tech was sitting until staffing sent his official sitter.

## 2015-08-06 NOTE — ED Notes (Signed)
Continue to attempt and contact Assessment to update on poc

## 2015-08-06 NOTE — ED Notes (Signed)
Visitors in room with pt at this time.  Sitter remains at bedside.

## 2015-08-07 DIAGNOSIS — F411 Generalized anxiety disorder: Secondary | ICD-10-CM | POA: Insufficient documentation

## 2015-08-07 DIAGNOSIS — F1014 Alcohol abuse with alcohol-induced mood disorder: Secondary | ICD-10-CM | POA: Insufficient documentation

## 2015-08-07 MED ORDER — METHOCARBAMOL 500 MG PO TABS: 500.0000 mg | ORAL_TABLET | Freq: Four times a day (QID) | ORAL | Status: DC | PRN

## 2015-08-07 MED ORDER — LORAZEPAM 1 MG PO TABS
1.0000 mg | ORAL_TABLET | Freq: Four times a day (QID) | ORAL | Status: DC | PRN
  Administered 2015-08-07: 1 mg via ORAL
  Filled 2015-08-07: qty 1

## 2015-08-07 MED ORDER — ONDANSETRON 4 MG PO TBDP: 4.0000 mg | ORAL_TABLET | Freq: Four times a day (QID) | ORAL | Status: DC | PRN

## 2015-08-07 MED ORDER — HYDROXYZINE HCL 25 MG PO TABS: 25.0000 mg | ORAL_TABLET | Freq: Four times a day (QID) | ORAL | Status: DC | PRN

## 2015-08-07 MED ORDER — LOPERAMIDE HCL 2 MG PO CAPS: 2.0000 mg | ORAL_CAPSULE | ORAL | Status: DC | PRN

## 2015-08-07 MED ORDER — THIAMINE HCL 100 MG/ML IJ SOLN
100.0000 mg | Freq: Once | INTRAMUSCULAR | Status: AC
Start: 2015-08-07 — End: 2015-08-07
  Administered 2015-08-07: 100 mg via INTRAMUSCULAR
  Filled 2015-08-07: qty 2

## 2015-08-07 MED ORDER — TRAZODONE HCL 50 MG PO TABS: 50.0000 mg | ORAL_TABLET | Freq: Every evening | ORAL | Status: DC | PRN

## 2015-08-07 MED ORDER — VITAMIN B-1 100 MG PO TABS: 100.0000 mg | ORAL_TABLET | Freq: Every day | ORAL | Status: DC

## 2015-08-07 MED ORDER — ADULT MULTIVITAMIN W/MINERALS CH
1.0000 | ORAL_TABLET | Freq: Every day | ORAL | Status: DC
  Administered 2015-08-07: 1 via ORAL
  Filled 2015-08-07: qty 1
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
## 2015-08-07 NOTE — [facility] Counselor (Signed)
[facility] Assessment Progress Note  [facility] Rutherford [place]) on 1/20 @ 9am and therapy Ophelia Charter([facility]ates) on 1/24 @ 10am. His mom will come to pick him up.   Johny ShockSamantha M. Ladona Ridgelaylor, MS, NCC, LPCA Counselor
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
## 2015-08-07 NOTE — Progress Notes (Signed)
Nursing Discharge Note:  Patient has received all belongings and signed for them as well as signing the Discharge Summary and also given a copy of same, and prescriptions for two medications. Patient continues to deny any active SI, HI/AVH and states understanding of followup appointments and medications prescribed. [facility]

## 2015-08-07 NOTE — Progress Notes (Signed)
Patient cooperative and compliant with medications, denies any HI/AVH but admitting to still having thoughts of SI with not specific plan.Patient verbally contracts for safety in that agrees to alert staff should he develop any such thoughts of self harm and thoughts to act upon them. Nurse ensuring continuous observation of patient for safety except when in the bathroom, administering medications as ordered and providing emotional support as well as therapeutic communication. Flu and Pneumonia vaccines administered as ordered. Patient remains safe on unit; voices no complaints.

## 2015-09-05 ENCOUNTER — Encounter (HOSPITAL_COMMUNITY): Payer: Self-pay | Admitting: Psychiatry

## 2015-09-05 ENCOUNTER — Ambulatory Visit (INDEPENDENT_AMBULATORY_CARE_PROVIDER_SITE_OTHER): Payer: Managed Care, Other (non HMO) | Admitting: Psychiatry

## 2015-09-05 ENCOUNTER — Encounter (INDEPENDENT_AMBULATORY_CARE_PROVIDER_SITE_OTHER): Payer: Self-pay

## 2015-09-05 VITALS — BP 117/83 | HR 103 | Ht 71.0 in | Wt 127.6 lb

## 2015-09-05 DIAGNOSIS — F332 Major depressive disorder, recurrent severe without psychotic features: Secondary | ICD-10-CM

## 2015-09-05 DIAGNOSIS — F411 Generalized anxiety disorder: Secondary | ICD-10-CM

## 2015-09-05 MED ORDER — MIRTAZAPINE 15 MG PO TABS: 15.0000 mg | ORAL_TABLET | Freq: Every day | ORAL | Status: DC

## 2015-09-05 NOTE — Progress Notes (Signed)
Psychiatric Initial Adult Assessment   Patient Identification: Francisco Walters MRN:  [id number] Date of Evaluation:  09/05/2015 Referral Source: : [facility] observation unit  Chief Complaint:   depression and anxiety Visit Diagnosis:    ICD-9-CM ICD-10-CM   1. 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 Generalized anxiety disorder 300.02 F41.1 CBC with Differential/Platelet     Comprehensive metabolic panel     Hemoglobin W1X     Lipid panel     T4 AND TSH   Diagnosis:   Patient Active Problem List   Diagnosis Date Noted  . GAD (generalized anxiety disorder) [F41.1]   . Alcohol abuse with alcohol-induced mood disorder (HCC) [F10.14]   . MDD (major depressive disorder), recurrent severe, without psychosis (HCC) [F33.2] 08/06/2015  . Generalized anxiety disorder [F41.1] 09/07/2012  . Depressive disorder, not elsewhere classified [F32.9] 09/17/2011   History of Present Illness:  20 year old white male referred from [facility] observation unit where he had been admitted in December 2016 after he made a suicide attempt and cut his arm. Patient was treated in observation and was started on hydroxyzine 25 mg by mouth 3 times a day and trazodone 50 mg at bedtime and was referred to outpatient.  Patient reports that in December his girlfriend of 2 years broke up. Actually he broke up with her because she was cheating on him and this made him suicidal he didn't want to live anymore.  He reports that he has been depressed since 04/28/2012 and even before that. He states that in 2013 his best friend committed suicide. After that he became quite depressed. In the past he has seen [name] and Forde Radon at this clinic.  Patient presently works at [employer] and lives with his mother in [place]. He states that he ran out of the  medications on January 10 and has not been on any medications since. Reports that his sleep is poor and he only gets about 4 hours of sleep and he is tired a lot. Appetite is poor mood is depressed anxious he ruminates about everything has to to 3 panic attacks a week. No obsessive-compulsive symptoms. Feels hopeless and helpless, has suicidal ideation but is able to contract for safety stating that the pain of death prevents him from attempting suicide. Denies homicidal ideation no hallucinations or delusions.  Patient states he smokes 1 pack of cigarettes per day, no alcohol he used marijuana from the ages of [age] and would use about a gram per day. At the present time he drinks 1-2 cups of coffee every day and 2 L of soda every day.   Associated Signs/Symptoms: Depression Symptoms:  depressed mood, anhedonia, insomnia, psychomotor retardation, fatigue, feelings of worthlessness/guilt, difficulty concentrating, hopelessness, anxiety, panic attacks, loss of energy/fatigue, decreased appetite, (Hypo) Manic Symptoms:  Distractibility, Anxiety Symptoms:  Excessive Worry, Panic Symptoms, Psychotic Symptoms:  None PTSD Symptoms: NA Previous Psychotropic Medications: Yes   Substance Abuse History in the last 12 months:  Yes.    Consequences of Substance Abuse: NA   Past psychiatric history -----------------------patient is seen [name] and Forde Radon in this clinic  Past Medical History. None Past Medical History  Diagnosis Date  . Depression   . Anxiety    No past surgical history on file. Family History:  Family  History  Problem Relation Age of Onset  . Bipolar disorder Father   . Bipolar disorder Paternal Grandmother    Social History:  Lives with his mother in [place] father is not in his life parents are divorced he works at [employer] Social History   Social History  . Marital Status: Single    Spouse Name: N/A  . Number of Children: N/A  . Years of  Education: N/A   Occupational History  . Student     11th grade    Social History Main Topics  . Smoking status: Current Every Day Smoker -- 1.00 packs/day    Types: Cigarettes  . Smokeless tobacco: Never Used  . Alcohol Use: 3.0 oz/week    5 Shots of liquor per week  . Drug Use: Yes    Special: Marijuana     Comment: rare use - 5 times in two years  . Sexual Activity: No   Other Topics Concern  . None   Social History Narrative   Additional Social History: Patient was born and raised in [place], states that he has always been bullied port his school life because of his red hair. He hated school because of this is concentration was good and then in high school in addition to his red hair he also had pretty severe acne for which she was bullied he graduated high school and went to work.   Musculoskeletal: Strength & Muscle Tone: within normal limits Gait & Station: normal Patient leans: Stand straight  Psychiatric Specialty Exam: HPI  Review of Systems  Respiratory: Positive for cough.   Psychiatric/Behavioral: Positive for depression. The patient is nervous/anxious and has insomnia.     Blood pressure 117/83, pulse 103, height  (1.803 m), weight 127 lb 9.6 oz (57.879 kg).Body mass index is 17.8 kg/(m^2).  General Appearance: Casual  Eye Contact:  Fair  Speech:  Clear and Coherent and Normal Rate  Volume:  Normal  Mood:  Anxious, Depressed, Dysphoric and Hopeless  Affect:  Constricted and Depressed  Thought Process:  Goal Directed, Linear and Logical  Orientation:  Full (Time, Place, and Person)  Thought Content:  Rumination  Suicidal Thoughts:  No  Homicidal Thoughts:  No  Memory:  Immediate;   Good Recent;   Good Remote;   Good  Judgement:  Good  Insight:  Good  Psychomotor Activity:  Normal  Concentration:  Good  Recall:  Good  Fund of Knowledge:Good  Language: Good  Akathisia:  No  Handed:  Right  AIMS (if indicated):    Assets:  Communication  Skills Desire for Improvement Financial Resources/Insurance Housing Physical Health Resilience Social Support Transportation  ADL's:  Intact  Cognition: WNL  Sleep:  Poor    Is the patient at risk to self?  No. Has the patient been a risk to self in the past 6 months?  Yes.   Has the patient been a risk to self within the distant past?  No. Is the patient a risk to others?  No. Has the patient been a risk to others in the past 6 months?  No. Has the patient been a risk to others within the distant past?  No.  Allergies:  No Known Allergies Current Medications: Current Outpatient Prescriptions  Medication Sig Dispense Refill  . mirtazapine (REMERON) 15 MG tablet Take 1 tablet (15 mg total) by mouth at bedtime. 30 tablet 2   No current facility-administered medications for this visit.      Medical Decision Making:  New problem, with additional work up planned, Review of Psycho-Social Stressors (1), Review or order clinical lab tests (1), Established Problem, Worsening (2), Review of Medication Regimen & Side Effects (2) and Review of New Medication or Change in Dosage (2)  Treatment Plan Summary: Medication management Plan #1 major depression recurrent. Discussed rationale risks benefits options of Remeron 15 mg by mouth daily at bedtime and patient gave informed consent to treat his depression. #2 generalized anxiety disorder Will be treated with Remeron.  CBT was discussed in detail. #3 insomnia Sleep hygiene was discussed.   patient will drop his soda intake to 1 L per day. And not use any caffeinated products after 4 PM. He has been encouraged to drink plenty of water he stated understanding. #4 labs CBC, CMP, TSH T4 and lipid panel was ordered #5 therapy Patient will be referred to a therapist after his stabilized #6 patient will return to see me in the clinic in 2 weeks. Call or call sooner if necessary. This was  initial visit of 60 minutes. More than 50% of this  time was spent in counseling and coordination discussing diagnosis medications sleep hygiene and cognitive behavior therapy regarding his anxiety. Also discussed grief therapy. Coping skills were also discussed, interpersonal and supportive therapy was provided.   Margit Banda 1/20/201710:26 AM

## 2015-09-09 ENCOUNTER — Encounter (HOSPITAL_COMMUNITY): Payer: Self-pay | Admitting: Psychology

## 2015-09-09 ENCOUNTER — Ambulatory Visit (INDEPENDENT_AMBULATORY_CARE_PROVIDER_SITE_OTHER): Payer: 59 | Admitting: Psychology

## 2015-09-09 DIAGNOSIS — F332 Major depressive disorder, recurrent severe without psychotic features: Secondary | ICD-10-CM

## 2015-09-09 DIAGNOSIS — F411 Generalized anxiety disorder: Secondary | ICD-10-CM

## 2015-09-09 NOTE — Progress Notes (Signed)
Comprehensive Clinical Assessment (CCA) Note  09/09/2015 COLE EASTRIDGE [id number]  Visit Diagnosis:      ICD-9-CM ICD-10-CM   1. MDD (major depressive disorder), recurrent severe, without psychosis (HCC) 296.33 F33.2   2. Generalized anxiety disorder 300.02 F41.1       CCA Part One  Part One has been completed on paper by the patient.  (See scanned document in Chart Review)  CCA Part Two A  Intake/Chief Complaint:  CCA Intake With Chief Complaint CCA Part Two Date: 09/09/15 CCA Part Two Time: 1000 Chief Complaint/Presenting Problem: pt presents as referred by [facility] Observation Unit where he presented w/ severe depression and suicidial ideation on 08/05/15.  pt has followed up with Dr. Rutherford Limerick on 09/05/15 and was dx w/ MDD, GAD.  pt had been seen by this counselor as an adolescent for school avoidance and depressive symptoms.  Pt reported that he been struggling w/ severe depression for about one month prior to seeking tx in December.  pt reported at the time break up w/ relationship was the final stressor at the time- pt reported that this no longer is a stressor.  pt reports that his own thinking is his main stressor.   Patients Currently Reported Symptoms/Problems: Pt reports that he has no motivation for anything, loss of interest severe, feels tired all the time, poor appetite, thoughts of not worth living- but not wanting to end his life.  Pt reports feeling bad about self.  Pt reports sleep disturbance with difficulty falling asleep- waking for several hours and then sleeping in.  Pt reports since starting medication he is falling asleep easier and sleeping throught the night- but struggling more w/ wakign up.  Pt reported that he only drinks occassionally- last use was 08/17/15.  Pt reports that he was smoking marijuna daily for about 3 years, but over the past month only 2-3 times.  pt reports last use of marijuana was 08/17/15.  Pt reported wanted to cut back after going "overboard".    Collateral Involvement: Dr. Debbora Presto notes. Individual's Strengths: seeking care and following through w/ recommendations.   Individual's Preferences: let the medication work for him- would like to feel motivated for things. Type of Services Patient Feels Are Needed: medication management, followup w/ counseling in a month  Mental Health Symptoms Depression:  Depression: Change in energy/activity, Fatigue, Hopelessness, Increase/decrease in appetite, Sleep (too much or little), Worthlessness  Mania:  Mania: N/A  Anxiety:   Anxiety: Fatigue, Restlessness, Sleep, Tension, Worrying  Psychosis:  Psychosis: N/A  Trauma:  Trauma: N/A  Obsessions:  Obsessions: N/A  Compulsions:  Compulsions: N/A  Inattention:  Inattention: N/A  Hyperactivity/Impulsivity:  Hyperactivity/Impulsivity: N/A  Oppositional/Defiant Behaviors:  Oppositional/Defiant Behaviors: N/A  Borderline Personality:  Emotional Irregularity: N/A  Other Mood/Personality Symptoms:      Mental Status Exam Appearance and self-care  Stature:  Stature: Average  Weight:  Weight: Average weight  Clothing:  Clothing: Neat/clean  Grooming:  Grooming: Normal  Cosmetic use:  Cosmetic Use: None  Posture/gait:  Posture/Gait: Normal  Motor activity:  Motor Activity: Not Remarkable  Sensorium  Attention:  Attention: Normal  Concentration:  Concentration: Normal  Orientation:  Orientation: X5  Recall/memory:  Recall/Memory: Normal  Affect and Mood  Affect:  Affect: Depressed  Mood:  Mood: Depressed, Anxious  Relating  Eye contact:  Eye Contact: Normal  Facial expression:  Facial Expression: Depressed  Attitude toward examiner:  Attitude Toward Examiner: Cooperative  Thought and Language  Speech flow: Speech Flow: Normal  Thought content:  Thought Content: Appropriate to mood and circumstances  Preoccupation:     Hallucinations:     Organization:     Company secretary of Knowledge:  Fund of Knowledge: Average   Intelligence:  Intelligence: Average  Abstraction:  Abstraction: Normal  Judgement:  Judgement: Normal  Reality Testing:  Reality Testing: Adequate  Insight:  Insight: Malissa Hippo  Decision Making:  Decision Making: Normal  Social Functioning  Social Maturity:  Social Maturity: Responsible  Social Judgement:  Social Judgement: Normal  Stress  Stressors:  Stressors:  (own thoughts- depressive)  Coping Ability:  Coping Ability: Deficient supports, Building surveyor Deficits:     Supports:      Family and Psychosocial History: Family history Marital status: Single (Pt is single.  He currently resides w/ his mother.  Pt left home at 17y/o and lived independently for about 1 year- moved back in w/ mom due to financial struggles ) Are you sexually active?:  (not currently) Does patient have children?: No  Childhood History:  Childhood History By whom was/is the patient raised?: Mother Additional childhood history information: Parents separated when pt was about 56 y/o.  Pt reports that dad was relatively absent from his life- visiting only at holidays.   Description of patient's relationship with caregiver when they were a child: conflictual w/ mom in teen years.  Limited contact with dad.   Patient's description of current relationship with people who raised him/her: Pt reports mom as support.  pt reported when he moved out- they became bestfriends.  Pt reports w/ work schedules and want for privacy they rarely have contact at home.   Does patient have siblings?: Yes Number of Siblings: 2 Description of patient's current relationship with siblings: Pt has 2 half siblings- a brother and sister- by dad.  pt reports that didn't grow up together as they were at least 10 years older.  pt reports no relationship w/ them current either.  Did patient suffer any verbal/emotional/physical/sexual abuse as a child?: No (pt did experience trauma and grief related to friend completing suicide Sept 2010  when he was 66 years old.  pt reported this was the only friend he engaged with at the time.  pt also reported he suffered bullying throughout school years by peers. ) Did patient suffer from severe childhood neglect?: No Has patient ever been sexually abused/assaulted/raped as an adolescent or adult?: No Was the patient ever a victim of a crime or a disaster?: No Witnessed domestic violence?: No Has patient been effected by domestic violence as an adult?: No  CCA Part Two B  Employment/Work Situation: Employment / Work Psychologist, occupational Employment situation: Employed (Pt reports he works about 35 hours a week) Where is patient currently employed?: [employer]  How long has patient been employed?: for 3 years Patient's job has been impacted by current illness: No What is the longest time patient has a held a job?: 3 years.  Where was the patient employed at that time?: current job Has patient ever been in the [employer]?: No Are There Guns or Other Weapons in Your Home?: No  Education: Engineer, civil (consulting) Currently Attending: not attending school Last Grade Completed: 12 Name of High School: [employer] Did Garment/textile technologist From [employer]?: Yes (Graduated 2014.  Pt dropped out [employer],  attended Adult H.S. at [employer] for a semester returned to traditional school to complete Junior year, then went to [employer]) Did You Attend College?: No Did You Attend Graduate School?: No  Did You Have Any Special Interests In School?: none Did You Have An Individualized Education Program (IIEP): No Did You Have Any Difficulty At School?: Yes (pt reports he strongly disliked school.  didn't want to do any work outside of school with mentality that didn't want to do work outside of school.  pt refused to attend school alot.  ) Were Any Medications Ever Prescribed For These Difficulties?: Yes Medications Prescribed For School Difficulties?: medication for depression  Religion: Religion/Spirituality Are You A  Religious Person?: No  Leisure/Recreation:    Exercise/Diet: Exercise/Diet Do You Exercise?: No Have You Gained or Lost A Significant Amount of Weight in the Past Six Months?: Yes-Gained (pt reported that he lost 10 lbs when weighed at ER.  pt reported since then has regained 10 lbs in the past month) Number of Pounds Gained: 10 Number of Pounds Lost?: 10 Do You Follow a Special Diet?: No Do You Have Any Trouble Sleeping?: Yes Explanation of Sleeping Difficulties: Pt reported sometimes difficulty falling asleep.  pt reported that usually would go to sleep around 10pm after coming home for work.  pt reported he would wake around 12 am and be up till about 4am.  pt rpeorted didn't want to feel like he wasn't doing anything all day.  pt reported he would usually then sleep till naturally woke- guessing he was sleeping about 10 hours.  pt reports he has been sleeping well since taking new medication- just struggles to wake up.    CCA Part Two C  Alcohol/Drug Use: Alcohol / Drug Use Pain Medications: none Prescriptions: Remeron Over the Counter: none History of alcohol / drug use?: Yes (pt reports marijuana preferred.  pt reports no use of any other drugs in the past or present.  pt reported use of alcohol only occassionally and very few times since turned 19y/o.  ) Substance #1 Name of Substance 1: Marijuana 1 - Age of First Use: 15 1 - Amount (size/oz): gram 1 - Frequency: 2-3 times a month in the past month; previously daily 1 - Duration: 3 years 1 - Last Use / Amount: last used 08/17/15                    CCA Part Three  ASAM's:  Six Dimensions of Multidimensional Assessment  Dimension 1:  Acute Intoxication and/or Withdrawal Potential:     Dimension 2:  Biomedical Conditions and Complications:     Dimension 3:  Emotional, Behavioral, or Cognitive Conditions and Complications:     Dimension 4:  Readiness to Change:     Dimension 5:  Relapse, Continued use, or Continued  Problem Potential:     Dimension 6:  Recovery/Living Environment:      Substance use Disorder (SUD)    Social Function:  Social Functioning Social Maturity: Responsible Social Judgement: Normal  Stress:  Stress Stressors:  (own thoughts- depressive) Coping Ability: Deficient supports, Overwhelmed Patient Takes Medications The Way The Doctor Instructed?: Yes  Risk Assessment- Self-Harm Potential: Risk Assessment For Self-Harm Potential Thoughts of Self-Harm: No current thoughts Method: No plan Availability of Means: No access/NA  Risk Assessment -Dangerous to Others Potential: Risk Assessment For Dangerous to Others Potential Method: No Plan  DSM5 Diagnoses: Patient Active Problem List   Diagnosis Date Noted  . GAD (generalized anxiety disorder)   . Alcohol abuse with alcohol-induced mood disorder (HCC)   . 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 Generalized anxiety disorder 09/07/2012  . Depressive  disorder, not elsewhere classified 09/17/2011    Patient Centered Plan: Patient is on the following Treatment Plan(s):  To be developed at next session w/ pt wants and goals for counseling.   Recommendations for Services/Supports/Treatments: Recommendations for Services/Supports/Treatments Recommendations For Services/Supports/Treatments: Individual Therapy, Medication Management  Treatment Plan Summary:    Pt to f/u as scheduled w/ Dr. Rutherford Limerick.   Pt to continue medication as prescribed.  Pt to call as needed or seek crisis care at ER- if SI w/ intent/plan.  Pt admits only minimally engaged w/ wanting counseling.  Pt aware may be linked to depression and low motivation for anything, including change.  Pt agrees for f/u in one month to check in and discuss further plans.   Forde Radon

## 2015-09-25 ENCOUNTER — Ambulatory Visit (HOSPITAL_COMMUNITY): Payer: Self-pay | Admitting: Psychiatry

## 2015-10-09 ENCOUNTER — Ambulatory Visit (HOSPITAL_COMMUNITY): Payer: Self-pay | Admitting: Psychology

## 2015-12-23 ENCOUNTER — Encounter (HOSPITAL_COMMUNITY): Payer: Self-pay | Admitting: Psychology

## 2016-03-11 ENCOUNTER — Encounter (HOSPITAL_COMMUNITY): Payer: Self-pay | Admitting: Psychology

## 2016-03-11 DIAGNOSIS — F332 Major depressive disorder, recurrent severe without psychotic features: Secondary | ICD-10-CM

## 2016-03-11 DIAGNOSIS — F411 Generalized anxiety disorder: Secondary | ICD-10-CM

## 2016-03-11 NOTE — Progress Notes (Signed)
Francisco Walters is a 20 y.o. male patient discharged from counseling as last seen on 09/09/15.  Outpatient Therapist Discharge Summary  Francisco Walters    02/26/1996   Admission Date: 09/09/15   Discharge Date:  03/11/16 Reason for Discharge:  Didn't return for services Diagnosis: at admission   MDD (major depressive disorder), recurrent severe, without psychosis (HCC)  Generalized anxiety disorder   Comments:  Pt didn't respond to attempt for f/u.   Alfredo Batty, LPC

## 2017-02-25 ENCOUNTER — Encounter (HOSPITAL_COMMUNITY): Payer: Self-pay | Admitting: *Deleted

## 2017-02-25 ENCOUNTER — Emergency Department (HOSPITAL_COMMUNITY)
Admission: EM | Admit: 2017-02-25 | Discharge: 2017-02-25 | Disposition: A | Discharge status: Home / Self Care | Payer: Managed Care, Other (non HMO) | Attending: Emergency Medicine | Admitting: Emergency Medicine

## 2017-02-25 DIAGNOSIS — F332 Major depressive disorder, recurrent severe without psychotic features: Secondary | ICD-10-CM | POA: Insufficient documentation

## 2017-02-25 DIAGNOSIS — F419 Anxiety disorder, unspecified: Secondary | ICD-10-CM | POA: Insufficient documentation

## 2017-02-25 DIAGNOSIS — F17210 Nicotine dependence, cigarettes, uncomplicated: Secondary | ICD-10-CM | POA: Diagnosis not present

## 2017-02-25 MED ORDER — HYDROXYZINE HCL 25 MG PO TABS: 50.0000 mg | ORAL_TABLET | Freq: Once | ORAL | Status: DC

## 2017-02-25 MED ORDER — TRAZODONE HCL 50 MG PO TABS: 50.0000 mg | ORAL_TABLET | Freq: Every day | ORAL | 0 refills | Status: DC

## 2017-02-25 MED ORDER — HYDROXYZINE HCL 25 MG PO TABS: 25.0000 mg | ORAL_TABLET | Freq: Four times a day (QID) | ORAL | 0 refills | Status: DC

## 2017-02-25 NOTE — ED Triage Notes (Signed)
Pt here requesting depression medication, hx of depression & anxiety, HR elevated 128, pt denies SI &HI, pt calm  & cooperative at this time, pt reports daily ETOH use, not requesting substance abuse help at this time, pt states, "i just need something for my depression."
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
## 2018-03-15 ENCOUNTER — Ambulatory Visit: Payer: Self-pay | Admitting: Physician Assistant

## 2020-04-10 ENCOUNTER — Ambulatory Visit (HOSPITAL_COMMUNITY)
Admission: EM | Admit: 2020-04-10 | Discharge: 2020-04-10 | Disposition: A | Discharge status: Home / Self Care | Payer: Self-pay | Attending: Emergency Medicine | Admitting: Emergency Medicine

## 2020-04-10 ENCOUNTER — Other Ambulatory Visit: Payer: Self-pay

## 2020-04-10 ENCOUNTER — Ambulatory Visit (INDEPENDENT_AMBULATORY_CARE_PROVIDER_SITE_OTHER): Payer: Self-pay

## 2020-04-10 ENCOUNTER — Encounter (HOSPITAL_COMMUNITY): Payer: Self-pay

## 2020-04-10 DIAGNOSIS — R112 Nausea with vomiting, unspecified: Secondary | ICD-10-CM | POA: Insufficient documentation

## 2020-04-10 DIAGNOSIS — R059 Cough, unspecified: Secondary | ICD-10-CM

## 2020-04-10 DIAGNOSIS — Z79899 Other long term (current) drug therapy: Secondary | ICD-10-CM | POA: Insufficient documentation

## 2020-04-10 DIAGNOSIS — R0602 Shortness of breath: Secondary | ICD-10-CM

## 2020-04-10 DIAGNOSIS — R1013 Epigastric pain: Secondary | ICD-10-CM | POA: Insufficient documentation

## 2020-04-10 DIAGNOSIS — F1010 Alcohol abuse, uncomplicated: Secondary | ICD-10-CM | POA: Insufficient documentation

## 2020-04-10 DIAGNOSIS — F411 Generalized anxiety disorder: Secondary | ICD-10-CM | POA: Insufficient documentation

## 2020-04-10 DIAGNOSIS — Z791 Long term (current) use of non-steroidal anti-inflammatories (NSAID): Secondary | ICD-10-CM | POA: Insufficient documentation

## 2020-04-10 DIAGNOSIS — Z20822 Contact with and (suspected) exposure to covid-19: Secondary | ICD-10-CM | POA: Insufficient documentation

## 2020-04-10 DIAGNOSIS — F332 Major depressive disorder, recurrent severe without psychotic features: Secondary | ICD-10-CM | POA: Insufficient documentation

## 2020-04-10 DIAGNOSIS — F17210 Nicotine dependence, cigarettes, uncomplicated: Secondary | ICD-10-CM | POA: Insufficient documentation

## 2020-04-10 DIAGNOSIS — R05 Cough: Secondary | ICD-10-CM | POA: Insufficient documentation

## 2020-04-10 LAB — SARS CORONAVIRUS 2 (TAT 6-24 HRS): SARS Coronavirus 2: NEGATIVE

## 2020-04-10 MED ORDER — ALBUTEROL SULFATE HFA 108 (90 BASE) MCG/ACT IN AERS: 1.0000 | INHALATION_SPRAY | Freq: Four times a day (QID) | RESPIRATORY_TRACT | 0 refills | Status: AC | PRN

## 2020-04-10 MED ORDER — NAPROXEN 500 MG PO TABS: 500.0000 mg | ORAL_TABLET | Freq: Two times a day (BID) | ORAL | 0 refills | Status: AC

## 2020-04-10 MED ORDER — AZITHROMYCIN 250 MG PO TABS: 250.0000 mg | ORAL_TABLET | Freq: Every day | ORAL | 0 refills | Status: AC

## 2020-04-10 MED ORDER — ONDANSETRON 4 MG PO TBDP: 4.0000 mg | ORAL_TABLET | Freq: Three times a day (TID) | ORAL | 0 refills | Status: AC | PRN

## 2020-04-10 MED ORDER — BENZONATATE 200 MG PO CAPS: 200.0000 mg | ORAL_CAPSULE | Freq: Three times a day (TID) | ORAL | 0 refills | Status: AC | PRN

## 2020-04-10 NOTE — ED Triage Notes (Signed)
Pt reports having chills, body aches and shortness of breath x 1 week. States the shortness of breath is "all the time and worsens in the past 3 days.

## 2020-04-10 NOTE — ED Provider Notes (Addendum)
[facility]    CSN: [id number] Arrival date & time: 04/10/20  1433      History   Chief Complaint Chief Complaint  Patient presents with  . Chills  . Shortness of Breath  . Generalized Body Aches    HPI Francisco Walters is a 24 y.o. male history of tobacco use, presenting today for evaluation of shortness of breath.  Patient reports he has felt shortness of breath chills and body aches for 1 week.  Symptoms worsened in the past 3 days.  Feels shortness of breath all the time. Feels as if he just ran a marathon. Reports nausea. Discomfort in chest and body aches.   Denies prior DVT/PE.  Denies leg pain or leg swelling.  Denies recent travel immobilization.  HPI  Past Medical History:  Diagnosis Date  . Anxiety   . Depression     Patient Active Problem List   Diagnosis Date Noted  . GAD (generalized anxiety disorder)   . Alcohol abuse with alcohol-induced mood disorder (HCC)   . 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 Generalized anxiety disorder 09/07/2012  . Depressive disorder, not elsewhere classified 09/17/2011    History reviewed. No pertinent surgical history.     Home Medications    Prior to Admission medications   Medication Sig Start Date End Date Taking? Authorizing Provider  albuterol (VENTOLIN HFA) 108 (90 Base) MCG/ACT inhaler Inhale 1-2 puffs into the lungs every 6 (six) hours as needed for wheezing or shortness of breath. 04/10/20   Domanique Huesman C, PA-C  azithromycin (ZITHROMAX) 250 MG tablet Take 1 tablet (250 mg total) by mouth daily. Take first 2 tablets together, then 1 every day until finished. 04/10/20   Alonna Bartling C, PA-C  benzonatate (TESSALON) 200 MG capsule Take 1 capsule (200 mg total) by mouth 3 (three) times daily as needed for up to 7 days for cough. 04/10/20 04/17/20  Jnaya Butrick C, PA-C  naproxen (NAPROSYN) 500 MG tablet Take 1 tablet (500 mg total) by mouth 2 (two)  times daily. 04/10/20   Uriah Philipson C, PA-C  ondansetron (ZOFRAN ODT) 4 MG disintegrating tablet Take 1 tablet (4 mg total) by mouth every 8 (eight) hours as needed for nausea or vomiting. 04/10/20   Tidus Upchurch C, PA-C  mirtazapine (REMERON) 15 MG tablet Take 1 tablet (15 mg total) by mouth at bedtime. 09/05/15 04/10/20  Gayland Curry, MD  traZODone (DESYREL) 50 MG tablet Take 1 tablet (50 mg total) by mouth at bedtime. 02/25/17 04/10/20  Arby Barrette, MD    Family History Family History  Problem Relation Age of Onset  . Bipolar disorder Father   . Depression Father   . Drug abuse Father   . Suicidality Father   . Alcohol abuse Mother   . Depression Brother   . Suicidality Brother   . Bipolar disorder Maternal Grandmother   . Alcohol abuse Maternal Grandmother     Social History Social History   Tobacco Use  . Smoking status: Current Every Day Smoker    Packs/day: 1.00    Years: 3.00    Pack years: 3.00    Types: Cigarettes  . Smokeless tobacco: Never Used  Substance Use Topics  . Alcohol use: Yes    Alcohol/week: 21.0 standard drinks    Types: 21 Cans of beer per week    Comment: daily  . Drug use: Yes    Types: Marijuana    Comment: last  08/17/15     Allergies   Patient has no known allergies.   Review of Systems Review of Systems  Constitutional: Positive for chills. Negative for activity change, appetite change, fatigue and fever.  HENT: Negative for congestion, ear pain, rhinorrhea, sinus pressure, sore throat and trouble swallowing.   Eyes: Negative for discharge and redness.  Respiratory: Positive for cough and shortness of breath. Negative for chest tightness.   Cardiovascular: Negative for chest pain.  Gastrointestinal: Positive for nausea and vomiting. Negative for abdominal pain and diarrhea.  Musculoskeletal: Negative for myalgias.  Skin: Negative for rash.  Neurological: Negative for dizziness, light-headedness and headaches.      Physical Exam Triage Vital Signs ED Triage Vitals  Enc Vitals Group     BP      Pulse      Resp      Temp      Temp src      SpO2      Weight      Height      Head Circumference      Peak Flow      Pain Score      Pain Loc      Pain Edu?      Excl. in GC?    No data found.  Updated Vital Signs BP (!) 142/96 (BP Location: Right Arm)   Pulse 99   Temp 98.4 F (36.9 C) (Oral)   Resp (!) 22   SpO2 100%   Visual Acuity Right Eye Distance:   Left Eye Distance:   Bilateral Distance:    Right Eye Near:   Left Eye Near:    Bilateral Near:     Physical Exam Vitals and nursing note reviewed.  Constitutional:      Appearance: He is well-developed.     Comments: No acute distress  HENT:     Head: Normocephalic and atraumatic.     Ears:     Comments: Bilateral ears without tenderness to palpation of external auricle, tragus and mastoid, EAC's without erythema or swelling, TM's with good bony landmarks and cone of light. Non erythematous.     Nose: Nose normal.     Mouth/Throat:     Comments: Oral mucosa pink and moist, no tonsillar enlargement or exudate. Posterior pharynx patent and nonerythematous, no uvula deviation or swelling. Normal phonation.  Eyes:     Conjunctiva/sclera: Conjunctivae normal.  Cardiovascular:     Rate and Rhythm: Normal rate.  Pulmonary:     Effort: Pulmonary effort is normal. No respiratory distress.     Comments: Breathing comfortably at rest, CTABL, no wheezing, rales or other adventitious sounds auscultated  Abdominal:     General: There is no distension.  Musculoskeletal:        General: Normal range of motion.     Cervical back: Neck supple.  Skin:    General: Skin is warm and dry.  Neurological:     Mental Status: He is alert and oriented to person, place, and time.      UC Treatments / Results  Labs (all labs ordered are listed, but only abnormal results are displayed) Labs Reviewed  SARS CORONAVIRUS 2 (TAT 6-24  HRS)    EKG   Radiology DG Chest 2 View  Result Date: 04/10/2020 CLINICAL DATA:  Shortness of breath EXAM: CHEST - 2 VIEW COMPARISON:  08/06/2015 FINDINGS: The heart size and mediastinal contours are within normal limits. Both lungs are clear. The visualized skeletal structures are unremarkable.  IMPRESSION: Normal study. Electronically Signed   By: Charlett Nose M.D.   On: 04/10/2020 16:28    Procedures Procedures (including critical care time)  Medications Ordered in UC Medications - No data to display  Initial Impression / Assessment and Plan / UC Course  I have reviewed the triage vital signs and the nursing notes.  Pertinent labs & imaging results that were available during my care of the patient were reviewed by me and considered in my medical decision making (see chart for details).     Covid test pending, chest x-ray normal.  Recommending symptomatic and supportive care, given symptoms x1 week will broaden coverage for atypicals with azithromycin, Tessalon for cough, Zofran for nausea, Naprosyn for body aches, no wheezing on exam, but given reported shortness of breath will provide albuterol as trial to use supportively.  Discussed strict return precautions. Patient verbalized understanding and is agreeable with plan.  Final Clinical Impressions(s) / UC Diagnoses   Final diagnoses:  Shortness of breath  Cough  Non-intractable vomiting with nausea, unspecified vomiting type  Abdominal pain, epigastric     Discharge Instructions     COIVD test pending, Monitor mychart for results Xray normal without pneumonia Tessalon/benzonatate every 8 hours as needed for cough Naprosyn twice daily with food to help with any inflammation in chest contributing to discomfort Zofran as needed for nausea Azithromycin- 2 tabs today, 1 tab for the following 4 days May try over-the-counter Pepcid/famotidine twice daily as needed for any increase in acid causing discomfort Albuterol  inhaler as needed for shortness of breath/chest tightness  Follow-up if any symptoms not improving or worsening    ED Prescriptions    Medication Sig Dispense Auth. Provider   naproxen (NAPROSYN) 500 MG tablet Take 1 tablet (500 mg total) by mouth 2 (two) times daily. 30 tablet Sharra Cayabyab C, PA-C   benzonatate (TESSALON) 200 MG capsule Take 1 capsule (200 mg total) by mouth 3 (three) times daily as needed for up to 7 days for cough. 28 capsule Aerilyn Slee C, PA-C   albuterol (VENTOLIN HFA) 108 (90 Base) MCG/ACT inhaler Inhale 1-2 puffs into the lungs every 6 (six) hours as needed for wheezing or shortness of breath. 8 g Paxson Harrower C, PA-C   ondansetron (ZOFRAN ODT) 4 MG disintegrating tablet Take 1 tablet (4 mg total) by mouth every 8 (eight) hours as needed for nausea or vomiting. 20 tablet Viviane Semidey C, PA-C   azithromycin (ZITHROMAX) 250 MG tablet Take 1 tablet (250 mg total) by mouth daily. Take first 2 tablets together, then 1 every day until finished. 6 tablet Bryan Goin, [place] C, PA-C     PDMP not reviewed this encounter.   Lew Dawes, PA-C 04/12/20 0816    Lew Dawes, PA-C 04/12/20 [phone]

## 2020-04-10 NOTE — Discharge Instructions (Addendum)
COIVD test pending, Monitor mychart for results Xray normal without pneumonia Tessalon/benzonatate every 8 hours as needed for cough Naprosyn twice daily with food to help with any inflammation in chest contributing to discomfort Zofran as needed for nausea Azithromycin- 2 tabs today, 1 tab for the following 4 days May try over-the-counter Pepcid/famotidine twice daily as needed for any increase in acid causing discomfort Albuterol inhaler as needed for shortness of breath/chest tightness  Follow-up if any symptoms not improving or worsening

## 2022-02-19 IMAGING — DX DG CHEST 2V
2 series · 2 of 2 positions shown · non-contrast
Comparison: 08/06/2015

CLINICAL DATA: Shortness of breath

EXAM:
CHEST - 2 VIEW

[chest pa]
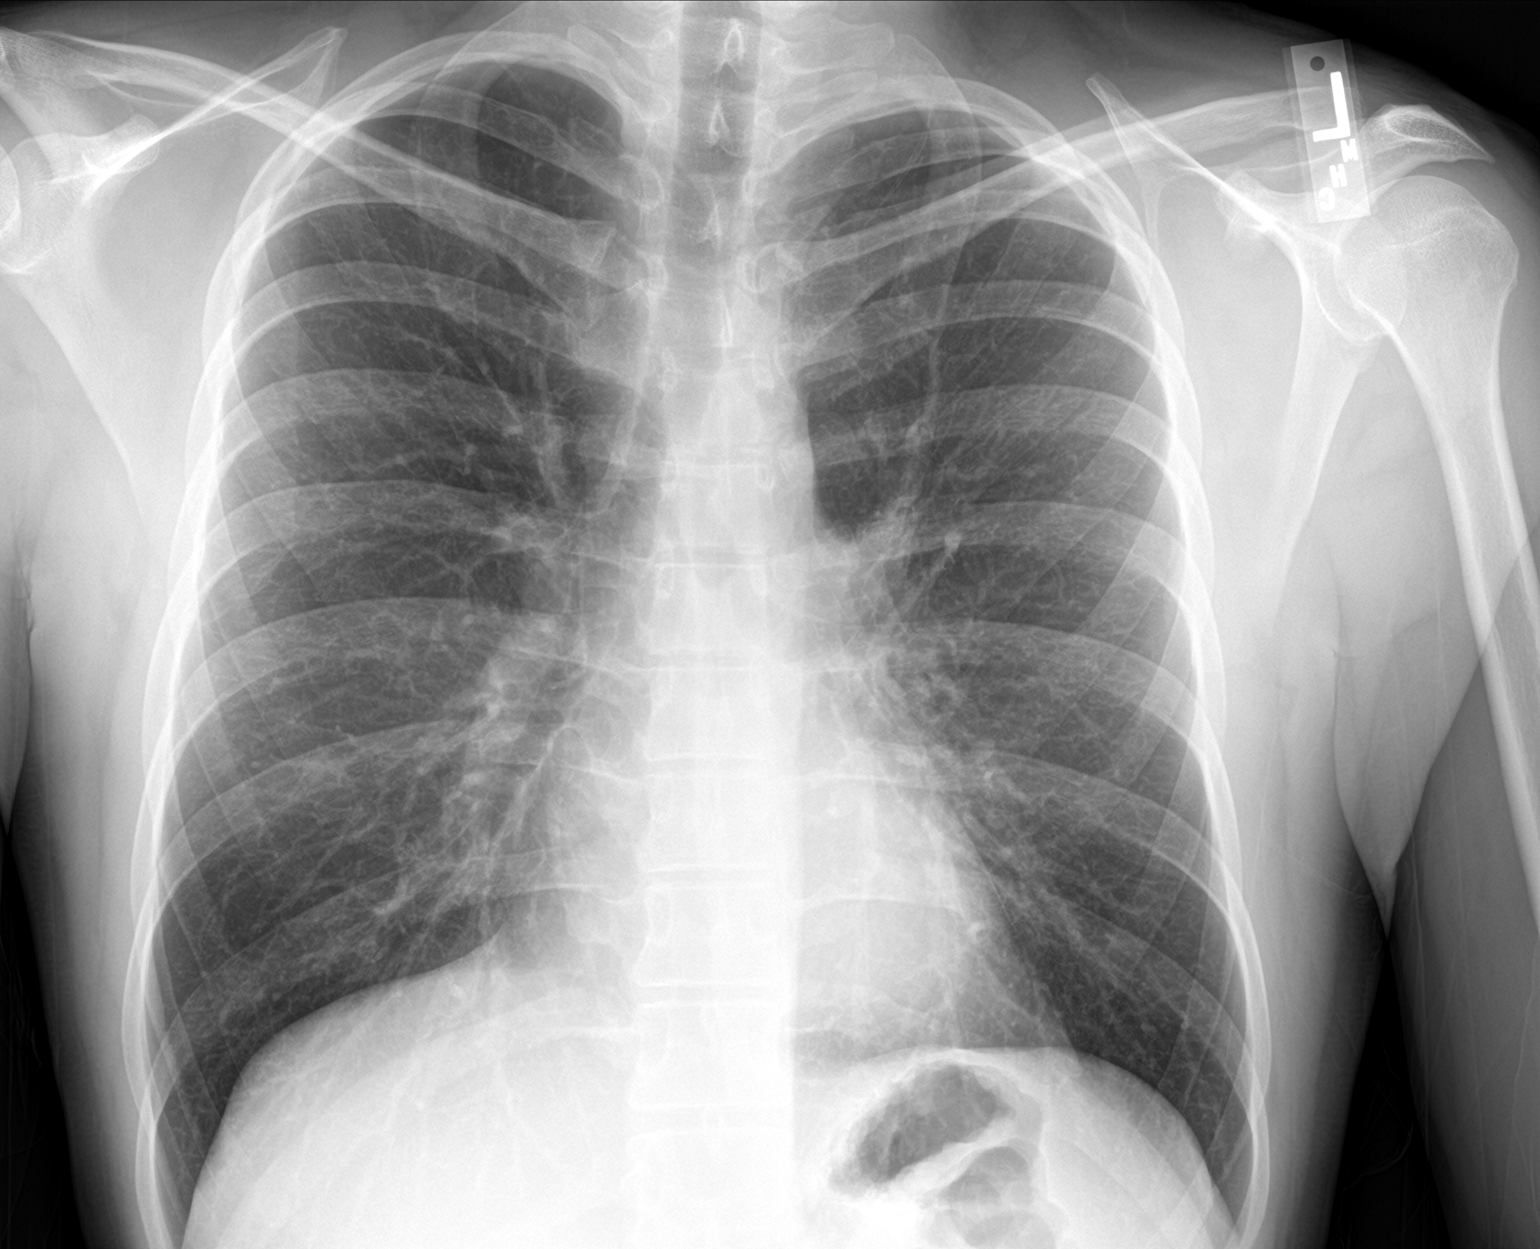

[chest lat]
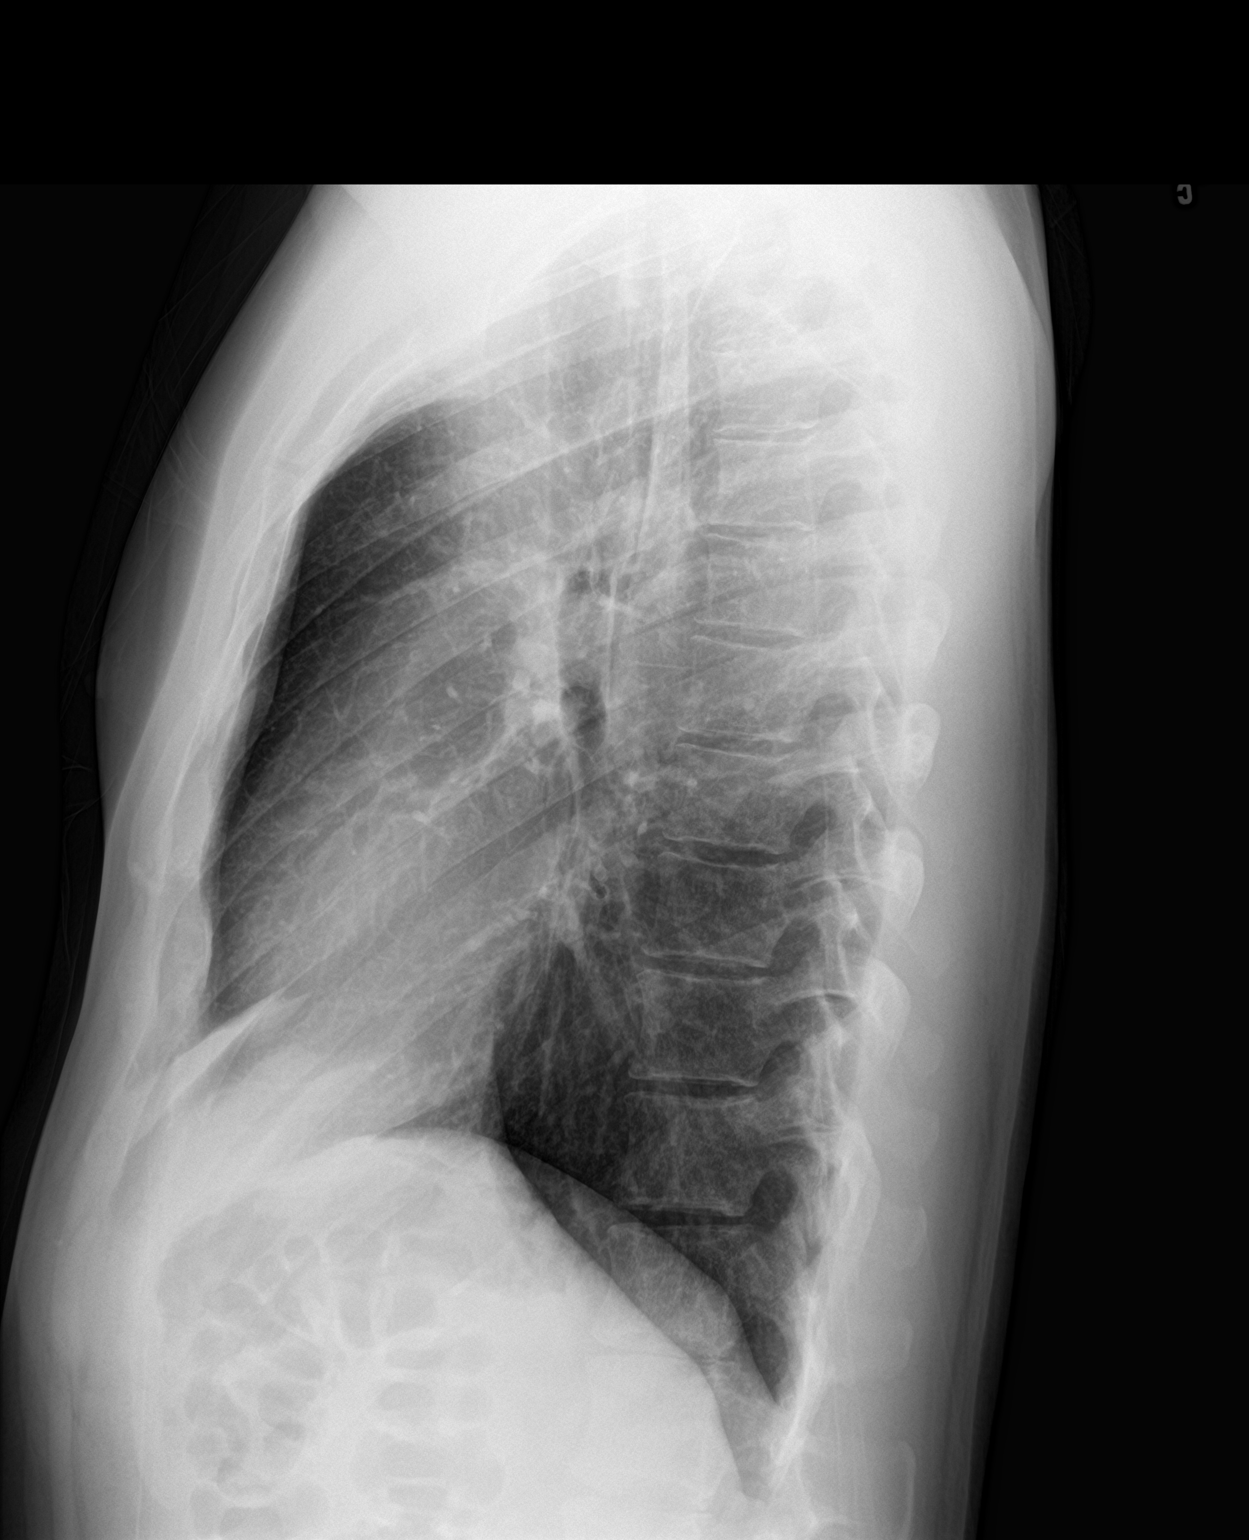

[2 of 2 positions shown; findings below may reference images not displayed]

FINDINGS: The heart size and mediastinal contours are within normal limits.
Both lungs are clear. The visualized skeletal structures are
unremarkable.
IMPRESSION: Normal study.
